# Patient Record
Sex: Male | Born: 1960 | Race: White | Hispanic: No | Marital: Married | Smoking: Former smoker
Health system: Southern US, Community
[De-identification: ages and names within clinical notes are randomized; demographics above are authoritative.]

## PROBLEM LIST (undated history)

## (undated) DIAGNOSIS — I2510 Atherosclerotic heart disease of native coronary artery without angina pectoris: Secondary | ICD-10-CM

## (undated) DIAGNOSIS — I7121 Aneurysm of the ascending aorta, without rupture: Secondary | ICD-10-CM

## (undated) DIAGNOSIS — Q231 Congenital insufficiency of aortic valve: Secondary | ICD-10-CM

## (undated) DIAGNOSIS — Q2381 Bicuspid aortic valve: Secondary | ICD-10-CM

## (undated) DIAGNOSIS — I712 Thoracic aortic aneurysm, without rupture: Secondary | ICD-10-CM

## (undated) DIAGNOSIS — E785 Hyperlipidemia, unspecified: Secondary | ICD-10-CM

## (undated) HISTORY — DX: Aneurysm of the ascending aorta, without rupture: I71.21

## (undated) HISTORY — DX: Congenital insufficiency of aortic valve: Q23.1

## (undated) HISTORY — DX: Thoracic aortic aneurysm, without rupture: I71.2

## (undated) HISTORY — DX: Atherosclerotic heart disease of native coronary artery without angina pectoris: I25.10

## (undated) HISTORY — DX: Hyperlipidemia, unspecified: E78.5

## (undated) HISTORY — DX: Bicuspid aortic valve: Q23.81

---

## 2006-10-15 ENCOUNTER — Ambulatory Visit: Payer: Self-pay | Admitting: Internal Medicine

## 2008-10-18 ENCOUNTER — Emergency Department: Payer: Self-pay | Admitting: Emergency Medicine

## 2010-06-19 ENCOUNTER — Emergency Department: Payer: Self-pay | Admitting: Emergency Medicine

## 2011-05-24 ENCOUNTER — Encounter: Payer: Self-pay | Admitting: Internal Medicine

## 2011-05-24 ENCOUNTER — Ambulatory Visit (INDEPENDENT_AMBULATORY_CARE_PROVIDER_SITE_OTHER): Payer: BC Managed Care – PPO | Admitting: Internal Medicine

## 2011-05-24 VITALS — BP 102/65 | HR 54 | Temp 98.5°F | Resp 16 | Ht 68.0 in | Wt 186.8 lb

## 2011-05-24 DIAGNOSIS — R011 Cardiac murmur, unspecified: Secondary | ICD-10-CM | POA: Insufficient documentation

## 2011-05-24 DIAGNOSIS — E785 Hyperlipidemia, unspecified: Secondary | ICD-10-CM | POA: Insufficient documentation

## 2011-05-24 LAB — COMPREHENSIVE METABOLIC PANEL
ALT: 20 U/L (ref 0–53)
AST: 24 U/L (ref 0–37)
Alkaline Phosphatase: 57 U/L (ref 39–117)
BUN: 7 mg/dL (ref 6–23)
Calcium: 9.5 mg/dL (ref 8.4–10.5)
Chloride: 105 mEq/L (ref 96–112)
Creatinine, Ser: 0.9 mg/dL (ref 0.4–1.5)
Total Bilirubin: 0.9 mg/dL (ref 0.3–1.2)

## 2011-05-24 LAB — LIPID PANEL
HDL: 44.1 mg/dL (ref >39.00)
Total CHOL/HDL Ratio: 3
Triglycerides: 86 mg/dL (ref 0.0–149.0)
VLDL: 17.2 mg/dL (ref 0.0–40.0)

## 2011-05-24 NOTE — Assessment & Plan Note (Signed)
Patient reports history of cardiac murmur, however none appreciated on exam today. Will get previous notes as to evaluation and management.

## 2011-05-24 NOTE — Assessment & Plan Note (Signed)
Will check lipids and LFTs with labs today. Follow up 6 months and prn. 

## 2011-05-24 NOTE — Progress Notes (Signed)
Subjective:    Patient ID: Nicholas Juarez, male    DOB: 11/07/1960, 51 y.o.   MRN: [id number]  HPI 51 year old male with history of hyperlipidemia presents to establish care. He reports that he is generally feeling well. He notes that, in the past, he was told he has a cardiac murmur. He has had imaging in the past for this. He is unsure what kind murmur. He denies any palpitations, chest pain, shortness of breath, or other symptoms. He reports that he generally feels well. He is very active at his work. He notes some recent increased stress with his wife's diagnosis of possible seizure disorder, and his daughters recent pregnancy.  Outpatient Encounter Prescriptions as of 05/24/2011  Medication Sig Dispense Refill  . aspirin 81 MG tablet Take 81 mg by mouth daily.      . cholecalciferol (VITAMIN D) 400 UNITS TABS Take by mouth daily.      . Coenzyme Q10-Levocarnitine 100-20 MG CAPS Take by mouth daily.      . Multiple Vitamin (MULTIVITAMIN) tablet Take 1 tablet by mouth daily.      . simvastatin (ZOCOR) 10 MG tablet Take 10 mg by mouth at bedtime.        Review of Systems  Constitutional: Negative for fever, chills, activity change, appetite change, fatigue and unexpected weight change.  Eyes: Negative for visual disturbance.  Respiratory: Negative for cough and shortness of breath.   Cardiovascular: Negative for chest pain, palpitations and leg swelling.  Gastrointestinal: Negative for abdominal pain and abdominal distention.  Genitourinary: Negative for dysuria, urgency and difficulty urinating.  Musculoskeletal: Negative for arthralgias and gait problem.  Skin: Negative for color change and rash.  Hematological: Negative for adenopathy.  Psychiatric/Behavioral: Negative for sleep disturbance and dysphoric mood. The patient is not nervous/anxious.    BP 102/65  Pulse 54  Temp(Src) 98.5 F (36.9 C) (Oral)  Resp 16  Ht 5\' 8"  (1.727 m)  Wt 186 lb 12 oz (84.709 kg)  BMI 28.40 kg/m2   SpO2 97%     Objective:   Physical Exam  Constitutional: He is oriented to person, place, and time. He appears well-developed and well-nourished. No distress.  HENT:  Head: Normocephalic and atraumatic.  Right Ear: External ear normal.  Left Ear: External ear normal.  Nose: Nose normal.  Mouth/Throat: Oropharynx is clear and moist. No oropharyngeal exudate.  Eyes: Conjunctivae and EOM are normal. Pupils are equal, round, and reactive to light. Right eye exhibits no discharge. Left eye exhibits no discharge. No scleral icterus.  Neck: Normal range of motion. Neck supple. No tracheal deviation present. No thyromegaly present.  Cardiovascular: Normal rate, regular rhythm and normal heart sounds.  Exam reveals no gallop and no friction rub.   No murmur heard. Pulmonary/Chest: Effort normal and breath sounds normal. No respiratory distress. He has no wheezes. He has no rales. He exhibits no tenderness.  Abdominal: Soft. Bowel sounds are normal. He exhibits no distension and no mass. There is no tenderness. There is no rebound and no guarding.  Musculoskeletal: Normal range of motion. He exhibits no edema.  Lymphadenopathy:    He has no cervical adenopathy.  Neurological: He is alert and oriented to person, place, and time. No cranial nerve deficit. Coordination normal.  Skin: Skin is warm and dry. No rash noted. He is not diaphoretic. No erythema. No pallor.  Psychiatric: He has a normal mood and affect. His behavior is normal. Judgment and thought content normal.  Assessment & Plan:

## 2011-11-25 ENCOUNTER — Ambulatory Visit: Payer: BC Managed Care – PPO | Admitting: Internal Medicine

## 2012-02-26 ENCOUNTER — Encounter: Payer: Self-pay | Admitting: Internal Medicine

## 2012-02-26 ENCOUNTER — Ambulatory Visit (INDEPENDENT_AMBULATORY_CARE_PROVIDER_SITE_OTHER): Payer: BC Managed Care – PPO | Admitting: Internal Medicine

## 2012-02-26 VITALS — BP 114/70 | HR 72 | Temp 98.1°F | Wt 196.0 lb

## 2012-02-26 DIAGNOSIS — R5381 Other malaise: Secondary | ICD-10-CM

## 2012-02-26 DIAGNOSIS — R0609 Other forms of dyspnea: Secondary | ICD-10-CM

## 2012-02-26 DIAGNOSIS — R0683 Snoring: Secondary | ICD-10-CM

## 2012-02-26 NOTE — Assessment & Plan Note (Signed)
Symptoms of fatigue most likely related to sleep schedule and ongoing stressors at home. Encouraged him to take time for himself. Will check TSH, B12, CBC, CMP with labs. Given ongoing snoring, we'll also set up sleep study. Followup 4 weeks.

## 2012-02-26 NOTE — Progress Notes (Signed)
  Subjective:    Patient ID: Nicholas Juarez, male    DOB: [birth year], 52 y.o.   MRN: [id number]  HPI 52 year old male presents for followup. His primary concern today is several months of progressive fatigue. He notes that during the day, if he is at home, he falls asleep very easily after sitting in a chair for a few moments. He also notes snoring at night. This is been ongoing for years. He denies any change in appetite, change in bowel habits. He denies any chest pain or shortness of breath. He notes significant stressors at home and caring for his wife who has a seizure disorder and for his father-in-law. 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  Outpatient Encounter Prescriptions as of 02/26/2012  Medication Sig Dispense Refill  . Multiple Vitamin (MULTIVITAMIN) tablet Take 1 tablet by mouth daily.      Marland Kitchen aspirin 81 MG tablet Take 81 mg by mouth daily.       No facility-administered encounter medications on file as of 02/26/2012.   BP 114/70  Pulse 72  Temp(Src) 98.1 F (36.7 C) (Oral)  Wt 196 lb (88.905 kg)  BMI 29.81 kg/m2  SpO2 97%  Review of Systems  Constitutional: Positive for fatigue. Negative for fever, chills, activity change, appetite change and unexpected weight change.  Eyes: Negative for visual disturbance.  Respiratory: Negative for cough and shortness of breath.   Cardiovascular: Negative for chest pain, palpitations and leg swelling.  Gastrointestinal: Negative for abdominal pain and abdominal distention.  Genitourinary: Negative for dysuria, urgency and difficulty urinating.  Musculoskeletal: Negative for arthralgias and gait problem.  Skin: Negative for color change and rash.  Hematological: Negative for adenopathy.  Psychiatric/Behavioral: Negative for sleep disturbance and dysphoric mood. The patient is not nervous/anxious.        Objective:   Physical Exam  Constitutional: He is oriented to person, place, and time. He appears  well-developed and well-nourished. No distress.  HENT:  Head: Normocephalic and atraumatic.  Right Ear: External ear normal.  Left Ear: External ear normal.  Nose: Nose normal.  Mouth/Throat: Oropharynx is clear and moist. No oropharyngeal exudate.  Eyes: Conjunctivae and EOM are normal. Pupils are equal, round, and reactive to light. Right eye exhibits no discharge. Left eye exhibits no discharge. No scleral icterus.  Neck: Normal range of motion. Neck supple. No tracheal deviation present. No thyromegaly present.  Cardiovascular: Normal rate, regular rhythm and normal heart sounds.  Exam reveals no gallop and no friction rub.   No murmur heard. Pulmonary/Chest: Effort normal and breath sounds normal. No respiratory distress. He has no wheezes. He has no rales. He exhibits no tenderness.  Musculoskeletal: Normal range of motion. He exhibits no edema.  Lymphadenopathy:    He has no cervical adenopathy.  Neurological: He is alert and oriented to person, place, and time. No cranial nerve deficit. Coordination normal.  Skin: Skin is warm and dry. No rash noted. He is not diaphoretic. No erythema. No pallor.  Psychiatric: He has a normal mood and affect. His behavior is normal. Judgment and thought content normal.          Assessment & Plan:

## 2012-02-26 NOTE — Assessment & Plan Note (Signed)
Symptoms of snoring and daytime somnolence concerning for sleep apnea. Will set up sleep study.

## 2012-02-27 LAB — CBC WITH DIFFERENTIAL/PLATELET
Basophils Relative: 0.2 % (ref 0.0–3.0)
HCT: 44.4 % (ref 39.0–52.0)
Hemoglobin: 15.2 g/dL (ref 13.0–17.0)
Lymphocytes Relative: 21.1 % (ref 12.0–46.0)
Lymphs Abs: 1.4 10*3/uL (ref 0.7–4.0)
MCHC: 34.2 g/dL (ref 30.0–36.0)
Monocytes Relative: 10.1 % (ref 3.0–12.0)
Neutro Abs: 4.3 10*3/uL (ref 1.4–7.7)
RBC: 5.08 Mil/uL (ref 4.22–5.81)

## 2012-02-27 LAB — COMPREHENSIVE METABOLIC PANEL
ALT: 22 U/L (ref 0–53)
Alkaline Phosphatase: 70 U/L (ref 39–117)
CO2: 29 mEq/L (ref 19–32)
Creatinine, Ser: 0.9 mg/dL (ref 0.4–1.5)
GFR: 90.11 mL/min (ref >60.00)
Sodium: 138 mEq/L (ref 135–145)
Total Bilirubin: 0.8 mg/dL (ref 0.3–1.2)

## 2012-02-27 LAB — TSH: TSH: 1.06 u[IU]/mL (ref 0.35–5.50)

## 2012-02-27 LAB — VITAMIN B12: Vitamin B-12: 356 pg/mL (ref 211–911)

## 2012-03-20 ENCOUNTER — Ambulatory Visit: Payer: Self-pay | Admitting: Internal Medicine

## 2012-04-01 ENCOUNTER — Encounter: Payer: Self-pay | Admitting: Internal Medicine

## 2012-04-01 ENCOUNTER — Ambulatory Visit (INDEPENDENT_AMBULATORY_CARE_PROVIDER_SITE_OTHER): Payer: BC Managed Care – PPO | Admitting: Internal Medicine

## 2012-04-01 VITALS — BP 108/78 | HR 70 | Temp 98.3°F | Wt 192.0 lb

## 2012-04-01 DIAGNOSIS — R5381 Other malaise: Secondary | ICD-10-CM

## 2012-04-01 DIAGNOSIS — J320 Chronic maxillary sinusitis: Secondary | ICD-10-CM | POA: Insufficient documentation

## 2012-04-01 DIAGNOSIS — H6692 Otitis media, unspecified, left ear: Secondary | ICD-10-CM | POA: Insufficient documentation

## 2012-04-01 DIAGNOSIS — H6690 Otitis media, unspecified, unspecified ear: Secondary | ICD-10-CM

## 2012-04-01 DIAGNOSIS — G4733 Obstructive sleep apnea (adult) (pediatric): Secondary | ICD-10-CM | POA: Insufficient documentation

## 2012-04-01 MED ORDER — AMOXICILLIN-POT CLAVULANATE 875-125 MG PO TABS: 1.0000 | ORAL_TABLET | Freq: Two times a day (BID) | ORAL | Status: DC

## 2012-04-01 NOTE — Assessment & Plan Note (Signed)
Recent workup for fatigue including lab work was unremarkable. However, sleep study showed obstructive sleep apnea. This is likely cause of symptoms of daytime somnolence. Will follow through with the CPAP titration study.

## 2012-04-01 NOTE — Assessment & Plan Note (Signed)
Recent sleep study showed sleep apnea. Titration study is pending. Will follow.

## 2012-04-01 NOTE — Progress Notes (Signed)
Subjective:    Patient ID: Nicholas Juarez, male    DOB: 01-Apr-1960, 52 y.o.   MRN: [id number]  HPI 52 year old male presents for followup after recent visit for fatigue. Labs at that visit including blood counts, thyroid function, electrolytes were normal. However, sleep study performed showed moderate sleep apnea. CPAP titration is pending. Patient reports persistent ongoing daytime fatigue.  He is also concerned today about one week history of nasal congestion, left maxillary sinus pain and pressure. He denies any fever or chills. He reports purulent nasal drainage. He has been taking over-the-counter cough and cold preparations with no improvement. He denies chest pain or shortness of breath.  Outpatient Encounter Prescriptions as of 04/01/2012  Medication Sig Dispense Refill  . aspirin 81 MG tablet Take 81 mg by mouth daily.      . Multiple Vitamin (MULTIVITAMIN) tablet Take 1 tablet by mouth daily.      Marland Kitchen OVER THE COUNTER MEDICATION Omega Red      . amoxicillin-clavulanate (AUGMENTIN) 875-125 MG per tablet Take 1 tablet by mouth 2 (two) times daily.  20 tablet  0   No facility-administered encounter medications on file as of 04/01/2012.   BP 108/78  Pulse 70  Temp(Src) 98.3 F (36.8 C) (Oral)  Wt 192 lb (87.091 kg)  BMI 29.2 kg/m2  SpO2 97%  Review of Systems  Constitutional: Positive for fatigue. Negative for fever, chills, activity change, appetite change and unexpected weight change.  HENT: Positive for ear pain, congestion, postnasal drip and sinus pressure.   Eyes: Negative for visual disturbance.  Respiratory: Negative for cough and shortness of breath.   Cardiovascular: Negative for chest pain, palpitations and leg swelling.  Gastrointestinal: Negative for abdominal pain and abdominal distention.  Genitourinary: Negative for dysuria, urgency and difficulty urinating.  Musculoskeletal: Negative for arthralgias and gait problem.  Skin: Negative for color change and rash.   Hematological: Negative for adenopathy.  Psychiatric/Behavioral: Negative for sleep disturbance and dysphoric mood. The patient is not nervous/anxious.        Objective:   Physical Exam  Constitutional: He is oriented to person, place, and time. He appears well-developed and well-nourished. No distress.  HENT:  Head: Normocephalic and atraumatic.  Right Ear: External ear normal. Tympanic membrane is not erythematous and not bulging. A middle ear effusion is present.  Left Ear: External ear normal. Tympanic membrane is erythematous and bulging. A middle ear effusion is present.  Nose: Mucosal edema present. Left sinus exhibits maxillary sinus tenderness.  Mouth/Throat: Oropharynx is clear and moist. No oropharyngeal exudate.  Eyes: Conjunctivae and EOM are normal. Pupils are equal, round, and reactive to light. Right eye exhibits no discharge. Left eye exhibits no discharge. No scleral icterus.  Neck: Normal range of motion. Neck supple. No tracheal deviation present. No thyromegaly present.  Cardiovascular: Normal rate, regular rhythm and normal heart sounds.  Exam reveals no gallop and no friction rub.   No murmur heard. Pulmonary/Chest: Effort normal and breath sounds normal. No respiratory distress. He has no wheezes. He has no rales. He exhibits no tenderness.  Musculoskeletal: Normal range of motion. He exhibits no edema.  Lymphadenopathy:    He has no cervical adenopathy.  Neurological: He is alert and oriented to person, place, and time. No cranial nerve deficit. Coordination normal.  Skin: Skin is warm and dry. No rash noted. He is not diaphoretic. No erythema. No pallor.  Psychiatric: He has a normal mood and affect. His behavior is normal. Judgment and thought content normal.  Assessment & Plan:

## 2012-04-01 NOTE — Assessment & Plan Note (Signed)
Symptoms and exam consistent with left OM. Will treat with augmentin and prn ibuprofen. Pt will call if symptoms are not improving over next 24-48hr.

## 2012-04-01 NOTE — Patient Instructions (Signed)
Start antibiotics. Take ibuprofen 800mg  up to three times daily as needed for pain. Claritin as needed for congestion.  Otitis Media, Adult A middle ear infection is an infection in the space behind the eardrum. The medical name for this is "otitis media." It may happen after a common cold. It is caused by a germ that starts growing in that space. You may feel swollen glands in your neck on the side of the ear infection. HOME CARE INSTRUCTIONS   Take your medicine as directed until it is gone, even if you feel better after the first few days.  Only take over-the-counter or prescription medicines for pain, discomfort, or fever as directed by your caregiver.  Occasional use of a nasal decongestant a couple times per day may help with discomfort and help the eustachian tube to drain better. Follow up with your caregiver in 10 to 14 days or as directed, to be certain that the infection has cleared. Not keeping the appointment could result in a chronic or permanent injury, pain, hearing loss and disability. If there is any problem keeping the appointment, you must call back to this facility for assistance. SEEK IMMEDIATE MEDICAL CARE IF:   You are not getting better in 2 to 3 days.  You have pain that is not controlled with medication.  You feel worse instead of better.  You cannot use the medication as directed.  You develop swelling, redness or pain around the ear or stiffness in your neck. MAKE SURE YOU:   Understand these instructions.  Will watch your condition.  Will get help right away if you are not doing well or get worse. Document Released: 09/29/2003 Document Revised: 03/18/2011 Document Reviewed: 07/31/2007 [facility] Patient Information 2013 [place], [place].

## 2012-05-08 ENCOUNTER — Encounter: Payer: Self-pay | Admitting: Internal Medicine

## 2012-05-08 ENCOUNTER — Ambulatory Visit (INDEPENDENT_AMBULATORY_CARE_PROVIDER_SITE_OTHER): Payer: BC Managed Care – PPO | Admitting: Internal Medicine

## 2012-05-08 ENCOUNTER — Telehealth: Payer: Self-pay | Admitting: Internal Medicine

## 2012-05-08 VITALS — BP 108/80 | HR 78 | Temp 98.0°F | Wt 194.0 lb

## 2012-05-08 DIAGNOSIS — Z634 Disappearance and death of family member: Secondary | ICD-10-CM

## 2012-05-08 DIAGNOSIS — G4733 Obstructive sleep apnea (adult) (pediatric): Secondary | ICD-10-CM

## 2012-05-08 NOTE — Assessment & Plan Note (Signed)
Pt father-in-law recently passed away unexpectedly. Offered support today. Counseling in place for family. Follow up prn.

## 2012-05-08 NOTE — Telephone Encounter (Signed)
Spoke with [facility], they are faxing [place] the report so I can attach it to [facility]'s order sheet.

## 2012-05-08 NOTE — Progress Notes (Signed)
  Subjective:    Patient ID: Nicholas Juarez, male    DOB: 07-Jun-1960, 52 y.o.   MRN: [id number]  HPI 52YO male with h/o sleep apnea presents for follow up. Pt was scheduled to have CPAP titration but could not afford, and insurance would not cover. Continues to have some daytime fatigue. Wife reports snoring at night. Pt father-in-law recently passed away unexpectedly. He feels he is coping well. Provides support to his wife who is managing father's affairs.  Outpatient Encounter Prescriptions as of 05/08/2012  Medication Sig Dispense Refill  . aspirin 81 MG tablet Take 81 mg by mouth daily.      . Multiple Vitamin (MULTIVITAMIN) tablet Take 1 tablet by mouth daily.      Marland Kitchen OVER THE COUNTER MEDICATION Omega Red      . [DISCONTINUED] amoxicillin-clavulanate (AUGMENTIN) 875-125 MG per tablet Take 1 tablet by mouth 2 (two) times daily.  20 tablet  0   No facility-administered encounter medications on file as of 05/08/2012.   BP 108/80  Pulse 78  Temp(Src) 98 F (36.7 C) (Oral)  Wt 194 lb (87.998 kg)  BMI 29.5 kg/m2  SpO2 97%  Review of Systems  Constitutional: Negative for fever, chills, activity change, appetite change, fatigue and unexpected weight change.  Eyes: Negative for visual disturbance.  Respiratory: Negative for cough and shortness of breath.   Cardiovascular: Negative for chest pain, palpitations and leg swelling.  Gastrointestinal: Negative for abdominal pain and abdominal distention.  Genitourinary: Negative for dysuria, urgency and difficulty urinating.  Musculoskeletal: Negative for arthralgias and gait problem.  Skin: Negative for color change and rash.  Hematological: Negative for adenopathy.  Psychiatric/Behavioral: Negative for sleep disturbance and dysphoric mood. The patient is not nervous/anxious.        Objective:   Physical Exam  Constitutional: He is oriented to person, place, and time. He appears well-developed and well-nourished. No distress.  HENT:   Head: Normocephalic and atraumatic.  Right Ear: External ear normal.  Left Ear: External ear normal.  Nose: Nose normal.  Mouth/Throat: Oropharynx is clear and moist. No oropharyngeal exudate.  Eyes: Conjunctivae and EOM are normal. Pupils are equal, round, and reactive to light. Right eye exhibits no discharge. Left eye exhibits no discharge. No scleral icterus.  Neck: Normal range of motion. Neck supple. No tracheal deviation present. No thyromegaly present.  Cardiovascular: Normal rate, regular rhythm and normal heart sounds.  Exam reveals no gallop and no friction rub.   No murmur heard. Pulmonary/Chest: Effort normal and breath sounds normal. No respiratory distress. He has no wheezes. He has no rales. He exhibits no tenderness.  Musculoskeletal: Normal range of motion. He exhibits no edema.  Lymphadenopathy:    He has no cervical adenopathy.  Neurological: He is alert and oriented to person, place, and time. No cranial nerve deficit. Coordination normal.  Skin: Skin is warm and dry. No rash noted. He is not diaphoretic. No erythema. No pallor.  Psychiatric: He has a normal mood and affect. His behavior is normal. Judgment and thought content normal.          Assessment & Plan:

## 2012-05-08 NOTE — Assessment & Plan Note (Signed)
Pt insurance will not cover recommended CPAP titration. Will try to get records on original sleep study and estimate CPAP need, then order supplies. Follow up 3-6 months.

## 2012-10-23 ENCOUNTER — Other Ambulatory Visit: Payer: Self-pay | Admitting: Internal Medicine

## 2012-10-23 ENCOUNTER — Ambulatory Visit (INDEPENDENT_AMBULATORY_CARE_PROVIDER_SITE_OTHER): Payer: BC Managed Care – PPO | Admitting: Internal Medicine

## 2012-10-23 ENCOUNTER — Encounter: Payer: Self-pay | Admitting: Internal Medicine

## 2012-10-23 VITALS — BP 110/82 | HR 88 | Temp 98.3°F | Ht 68.0 in | Wt 190.0 lb

## 2012-10-23 DIAGNOSIS — H6690 Otitis media, unspecified, unspecified ear: Secondary | ICD-10-CM

## 2012-10-23 DIAGNOSIS — H6692 Otitis media, unspecified, left ear: Secondary | ICD-10-CM | POA: Insufficient documentation

## 2012-10-23 DIAGNOSIS — Z23 Encounter for immunization: Secondary | ICD-10-CM | POA: Insufficient documentation

## 2012-10-23 DIAGNOSIS — Z1211 Encounter for screening for malignant neoplasm of colon: Secondary | ICD-10-CM | POA: Insufficient documentation

## 2012-10-23 DIAGNOSIS — Z0000 Encounter for general adult medical examination without abnormal findings: Secondary | ICD-10-CM

## 2012-10-23 LAB — COMPREHENSIVE METABOLIC PANEL
ALT: 26 U/L (ref 0–53)
Albumin: 4 g/dL (ref 3.5–5.2)
Alkaline Phosphatase: 76 U/L (ref 39–117)
CO2: 28 mEq/L (ref 19–32)
Calcium: 9.6 mg/dL (ref 8.4–10.5)
Chloride: 103 mEq/L (ref 96–112)
Creatinine, Ser: 1.1 mg/dL (ref 0.4–1.5)
GFR: 74.97 mL/min (ref >60.00)
Potassium: 4.3 mEq/L (ref 3.5–5.1)
Total Protein: 7.7 g/dL (ref 6.0–8.3)

## 2012-10-23 LAB — LIPID PANEL
HDL: 37.7 mg/dL — ABNORMAL LOW (ref >39.00)
Triglycerides: 157 mg/dL — ABNORMAL HIGH (ref 0.0–149.0)

## 2012-10-23 LAB — CBC WITH DIFFERENTIAL/PLATELET
Basophils Relative: 0.6 % (ref 0.0–3.0)
Eosinophils Relative: 2.4 % (ref 0.0–5.0)
HCT: 43.5 % (ref 39.0–52.0)
Hemoglobin: 14.5 g/dL (ref 13.0–17.0)
Lymphocytes Relative: 21.2 % (ref 12.0–46.0)
Lymphs Abs: 1.8 10*3/uL (ref 0.7–4.0)
MCV: 88.1 fl (ref 78.0–100.0)
Monocytes Absolute: 0.9 10*3/uL (ref 0.1–1.0)
Monocytes Relative: 11 % (ref 3.0–12.0)
Neutro Abs: 5.5 10*3/uL (ref 1.4–7.7)
RBC: 4.94 Mil/uL (ref 4.22–5.81)
RDW: 13.9 % (ref 11.5–14.6)
WBC: 8.4 10*3/uL (ref 4.5–10.5)

## 2012-10-23 MED ORDER — AMOXICILLIN-POT CLAVULANATE 875-125 MG PO TABS: 1.0000 | ORAL_TABLET | Freq: Two times a day (BID) | ORAL | Status: DC

## 2012-10-23 NOTE — Assessment & Plan Note (Signed)
General medical exam normal today. Encouraged continued efforts at healthy diet and regular physical activity. Colonoscopy ordered. Will check labs today including CMP, lipid profile, PSA. Discussed the potential benefits and limitations of PSA testing. Influenza vaccine given today.

## 2012-10-23 NOTE — Assessment & Plan Note (Signed)
Exam is consistent with left otitis media. Will treat with Augmentin. Patient will use ibuprofen or Tylenol as needed for pain. Followup if symptoms are not improving.

## 2012-10-23 NOTE — Patient Instructions (Signed)
Please call or return to clinic if ear pain is persistent after starting antibiotics.

## 2012-10-23 NOTE — Progress Notes (Signed)
Stress Test requested.

## 2012-10-23 NOTE — Progress Notes (Signed)
Subjective:    Patient ID: Nicholas Juarez, male    DOB: [birth year], 52 y.o.   MRN: [id number]  HPI 52 year old male presents for annual exam. He reports that he is generally doing well. It has been a difficult time for his family as his wife had an attempted suicide and was hospitalized twice. He feels he is coping well. He continues to work full-time. He is physically active at work. He tries to follow a healthy diet. He is concerned about several week history of left ear pain. He denies any fever or chills. He denies nasal congestion, cough. He has not taken anything for this.  Outpatient Encounter Prescriptions as of 10/23/2012  Medication Sig Dispense Refill  . aspirin 81 MG tablet Take 81 mg by mouth daily.      . Multiple Vitamin (MULTIVITAMIN) tablet Take 1 tablet by mouth daily.      Marland Kitchen OVER THE COUNTER MEDICATION Omega Red       No facility-administered encounter medications on file as of 10/23/2012.   BP 110/82  Pulse 88  Temp(Src) 98.3 F (36.8 C) (Oral)  Ht 5\' 8"  (1.727 m)  Wt 190 lb (86.183 kg)  BMI 28.9 kg/m2  SpO2 96%  Review of Systems  Constitutional: Negative for fever, chills, activity change, appetite change, fatigue and unexpected weight change.  HENT: Positive for ear pain. Negative for ear discharge, postnasal drip, rhinorrhea, sinus pressure and sore throat.   Eyes: Negative for visual disturbance.  Respiratory: Negative for cough and shortness of breath.   Cardiovascular: Negative for chest pain, palpitations and leg swelling.  Gastrointestinal: Negative for abdominal pain and abdominal distention.  Genitourinary: Negative for dysuria, urgency and difficulty urinating.  Musculoskeletal: Negative for arthralgias and gait problem.  Skin: Negative for color change and rash.  Hematological: Negative for adenopathy.  Psychiatric/Behavioral: Negative for sleep disturbance and dysphoric mood. The patient is not nervous/anxious.        Objective:   Physical  Exam  Constitutional: He is oriented to person, place, and time. He appears well-developed and well-nourished. No distress.  HENT:  Head: Normocephalic and atraumatic.  Right Ear: Tympanic membrane, external ear and ear canal normal. No middle ear effusion.  Left Ear: External ear normal. Tympanic membrane is erythematous and bulging. A middle ear effusion is present.  Nose: Nose normal.  Mouth/Throat: Oropharynx is clear and moist. No oropharyngeal exudate.  Eyes: Conjunctivae and EOM are normal. Pupils are equal, round, and reactive to light. Right eye exhibits no discharge. Left eye exhibits no discharge. No scleral icterus.  Neck: Normal range of motion. Neck supple. No tracheal deviation present. No thyromegaly present.  Cardiovascular: Normal rate, regular rhythm and normal heart sounds.  Exam reveals no gallop and no friction rub.   No murmur heard. Pulmonary/Chest: Effort normal and breath sounds normal. No respiratory distress. He has no wheezes. He has no rales. He exhibits no tenderness.  Abdominal: Soft. Bowel sounds are normal. He exhibits no distension and no mass. There is no tenderness. There is no rebound and no guarding.  Musculoskeletal: Normal range of motion. He exhibits no edema.  Lymphadenopathy:    He has no cervical adenopathy.  Neurological: He is alert and oriented to person, place, and time. No cranial nerve deficit. Coordination normal.  Skin: Skin is warm and dry. No rash noted. He is not diaphoretic. No erythema. No pallor.  Psychiatric: He has a normal mood and affect. His behavior is normal. Judgment and thought content normal.  Assessment & Plan:

## 2012-10-29 ENCOUNTER — Telehealth: Payer: Self-pay | Admitting: *Deleted

## 2012-10-29 NOTE — Telephone Encounter (Signed)
Informed patient wife paperwork was faxed to his employer on Monday, originals mailed to home address on file.

## 2012-11-02 ENCOUNTER — Encounter: Payer: Self-pay | Admitting: *Deleted

## 2012-11-05 ENCOUNTER — Encounter: Payer: Self-pay | Admitting: Emergency Medicine

## 2012-11-09 ENCOUNTER — Ambulatory Visit: Payer: BC Managed Care – PPO | Admitting: Internal Medicine

## 2013-10-25 ENCOUNTER — Encounter: Payer: Self-pay | Admitting: Internal Medicine

## 2013-10-25 ENCOUNTER — Encounter: Payer: Self-pay | Admitting: *Deleted

## 2013-10-25 ENCOUNTER — Ambulatory Visit (INDEPENDENT_AMBULATORY_CARE_PROVIDER_SITE_OTHER): Payer: BC Managed Care – PPO | Admitting: Internal Medicine

## 2013-10-25 VITALS — BP 110/72 | HR 54 | Temp 98.0°F | Resp 14 | Ht 68.5 in | Wt 193.2 lb

## 2013-10-25 DIAGNOSIS — Z0000 Encounter for general adult medical examination without abnormal findings: Secondary | ICD-10-CM

## 2013-10-25 DIAGNOSIS — N529 Male erectile dysfunction, unspecified: Secondary | ICD-10-CM

## 2013-10-25 LAB — COMPREHENSIVE METABOLIC PANEL
ALT: 20 U/L (ref 0–53)
AST: 21 U/L (ref 0–37)
Albumin: 3.6 g/dL (ref 3.5–5.2)
Alkaline Phosphatase: 71 U/L (ref 39–117)
BILIRUBIN TOTAL: 0.7 mg/dL (ref 0.2–1.2)
BUN: 13 mg/dL (ref 6–23)
CALCIUM: 9.5 mg/dL (ref 8.4–10.5)
CO2: 28 mEq/L (ref 19–32)
CREATININE: 1 mg/dL (ref 0.4–1.5)
Chloride: 102 mEq/L (ref 96–112)
GFR: 88.44 mL/min (ref >60.00)
Glucose, Bld: 98 mg/dL (ref 70–99)
Potassium: 4.6 mEq/L (ref 3.5–5.1)
Sodium: 140 mEq/L (ref 135–145)
Total Protein: 7.6 g/dL (ref 6.0–8.3)

## 2013-10-25 LAB — CBC WITH DIFFERENTIAL/PLATELET
BASOS ABS: 0 10*3/uL (ref 0.0–0.1)
Basophils Relative: 0.4 % (ref 0.0–3.0)
EOS ABS: 0.6 10*3/uL (ref 0.0–0.7)
Eosinophils Relative: 6.7 % — ABNORMAL HIGH (ref 0.0–5.0)
HCT: 47.7 % (ref 39.0–52.0)
HEMOGLOBIN: 15.4 g/dL (ref 13.0–17.0)
LYMPHS PCT: 21.8 % (ref 12.0–46.0)
Lymphs Abs: 1.9 10*3/uL (ref 0.7–4.0)
MCHC: 32.4 g/dL (ref 30.0–36.0)
MCV: 90.1 fl (ref 78.0–100.0)
Monocytes Absolute: 0.8 10*3/uL (ref 0.1–1.0)
Monocytes Relative: 8.7 % (ref 3.0–12.0)
NEUTROS ABS: 5.4 10*3/uL (ref 1.4–7.7)
Neutrophils Relative %: 62.4 % (ref 43.0–77.0)
PLATELETS: 344 10*3/uL (ref 150.0–400.0)
RBC: 5.29 Mil/uL (ref 4.22–5.81)
RDW: 13.8 % (ref 11.5–15.5)
WBC: 8.7 10*3/uL (ref 4.0–10.5)

## 2013-10-25 LAB — LIPID PANEL
Cholesterol: 199 mg/dL (ref 0–200)
HDL: 39.6 mg/dL (ref >39.00)
LDL Cholesterol: 149 mg/dL — ABNORMAL HIGH (ref 0–99)
NONHDL: 159.4
Total CHOL/HDL Ratio: 5
Triglycerides: 54 mg/dL (ref 0.0–149.0)
VLDL: 10.8 mg/dL (ref 0.0–40.0)

## 2013-10-25 LAB — HM COLONOSCOPY

## 2013-10-25 MED ORDER — TADALAFIL 20 MG PO TABS: 10.0000 mg | ORAL_TABLET | ORAL | Status: DC | PRN

## 2013-10-25 NOTE — Assessment & Plan Note (Signed)
Occasional difficulty maintaining erection. Discussed options for treatment. Will start Cialis 10mg  prn. He will call if symptoms are not improved with this.

## 2013-10-25 NOTE — Progress Notes (Signed)
Pre visit review using our clinic review tool, if applicable. No additional management support is needed unless otherwise documented below in the visit note. 

## 2013-10-25 NOTE — Assessment & Plan Note (Signed)
General medical exam normal today. Discussed screening for prostate cancer and limitations of PSA testing. Will check PSA with labs today. Will also check CMP, lipids, CBC. Encouraged healthy diet and exercise. Encouraged him to take time for himself. He will call if anxiety/stress becoming a concern. Flu vaccine through his work. He declines colonoscopy.

## 2013-10-25 NOTE — Patient Instructions (Signed)

## 2013-10-25 NOTE — Progress Notes (Signed)
Subjective:    Patient ID: Nicholas JourneyJames W Kube Jr., male    DOB: [birth year], 53 y.o.   MRN: [id number]  HPI 53YO male presents for annual exam. Feeling well. Notes increased stress recently with son arrested for DWI. Continues to work 3rd shift, so notes some fatigue. Very physically active at work, walks all night. Trying to follow a healthy diet. Scheduled to get Flu shot at work. Does not want to have colonoscopy. Concerned about occasional erectile dysfunction. Unable to maintain erection at times. Question if any OTC meds might help.  Review of Systems  Constitutional: Negative for fever, chills, activity change, appetite change, fatigue and unexpected weight change.  Eyes: Negative for visual disturbance.  Respiratory: Negative for cough and shortness of breath.   Cardiovascular: Negative for chest pain, palpitations and leg swelling.  Gastrointestinal: Negative for nausea, vomiting, abdominal pain, diarrhea, constipation, blood in stool and abdominal distention.  Genitourinary: Negative for dysuria, urgency, discharge, difficulty urinating and penile pain.  Musculoskeletal: Negative for arthralgias and gait problem.  Skin: Negative for color change and rash.  Hematological: Negative for adenopathy.  Psychiatric/Behavioral: Positive for sleep disturbance. Negative for dysphoric mood. The patient is nervous/anxious.        Objective:    BP 110/72  Pulse 54  Temp(Src) 98 F (36.7 C) (Oral)  Resp 14  Ht 5' 8.5" (1.74 m)  Wt 193 lb 4 oz (87.658 kg)  BMI 28.95 kg/m2  SpO2 97% Physical Exam  Constitutional: He is oriented to person, place, and time. He appears well-developed and well-nourished. No distress.  HENT:  Head: Normocephalic and atraumatic.  Right Ear: External ear normal.  Left Ear: External ear normal.  Nose: Nose normal.  Mouth/Throat: Oropharynx is clear and moist. No oropharyngeal exudate.  Eyes: Conjunctivae and EOM are normal. Pupils are equal, round, and  reactive to light. Right eye exhibits no discharge. Left eye exhibits no discharge. No scleral icterus.  Neck: Normal range of motion. Neck supple. No tracheal deviation present. No thyromegaly present.  Cardiovascular: Normal rate, regular rhythm and normal heart sounds.  Exam reveals no gallop and no friction rub.   No murmur heard. Pulmonary/Chest: Effort normal and breath sounds normal. No respiratory distress. He has no wheezes. He has no rales. He exhibits no tenderness.  Abdominal: Soft. Bowel sounds are normal. He exhibits no distension and no mass. There is no tenderness. There is no rebound and no guarding.  Musculoskeletal: Normal range of motion. He exhibits no edema.  Lymphadenopathy:    He has no cervical adenopathy.  Neurological: He is alert and oriented to person, place, and time. No cranial nerve deficit. Coordination normal.  Skin: Skin is warm and dry. No rash noted. He is not diaphoretic. No erythema. No pallor.  Psychiatric: He has a normal mood and affect. His behavior is normal. Judgment and thought content normal.          Assessment & Plan:   Problem List Items Addressed This Visit     Unprioritized   Erectile dysfunction     Occasional difficulty maintaining erection. Discussed options for treatment. Will start Cialis 10mg  prn. He will call if symptoms are not improved with this.    Routine general medical examination at a health care facility - Primary     General medical exam normal today. Discussed screening for prostate cancer and limitations of PSA testing. Will check PSA with labs today. Will also check CMP, lipids, CBC. Encouraged healthy diet and exercise. Encouraged him  to take time for himself. He will call if anxiety/stress becoming a concern. Flu vaccine through his work. He declines colonoscopy.    Relevant Orders      PSA, total and free      CBC with Differential      Comprehensive metabolic panel      Lipid panel       Return in about 1  year (around 10/26/2014) for Physical.

## 2013-10-26 ENCOUNTER — Encounter: Payer: Self-pay | Admitting: *Deleted

## 2013-10-26 LAB — PSA, TOTAL AND FREE
PSA FREE: 0.21 ng/mL
PSA, Free Pct: 17 % — ABNORMAL LOW (ref >25)
PSA: 1.24 ng/mL (ref <=4.00)

## 2015-01-23 ENCOUNTER — Encounter: Payer: Self-pay | Admitting: Emergency Medicine

## 2015-01-23 ENCOUNTER — Observation Stay
Admit: 2015-01-23 | Discharge: 2015-01-23 | Disposition: A | Discharge status: Home / Self Care | Payer: BLUE CROSS/BLUE SHIELD | Attending: Internal Medicine | Admitting: Internal Medicine

## 2015-01-23 ENCOUNTER — Emergency Department: Payer: BLUE CROSS/BLUE SHIELD

## 2015-01-23 ENCOUNTER — Observation Stay
Admission: EM | Admit: 2015-01-23 | Discharge: 2015-01-24 | Disposition: A | Discharge status: Home / Self Care | Payer: BLUE CROSS/BLUE SHIELD | Attending: Internal Medicine | Admitting: Internal Medicine

## 2015-01-23 DIAGNOSIS — Z87891 Personal history of nicotine dependence: Secondary | ICD-10-CM | POA: Insufficient documentation

## 2015-01-23 DIAGNOSIS — Z79899 Other long term (current) drug therapy: Secondary | ICD-10-CM | POA: Insufficient documentation

## 2015-01-23 DIAGNOSIS — E785 Hyperlipidemia, unspecified: Secondary | ICD-10-CM | POA: Diagnosis not present

## 2015-01-23 DIAGNOSIS — Z886 Allergy status to analgesic agent status: Secondary | ICD-10-CM | POA: Insufficient documentation

## 2015-01-23 DIAGNOSIS — Z23 Encounter for immunization: Secondary | ICD-10-CM | POA: Insufficient documentation

## 2015-01-23 DIAGNOSIS — I712 Thoracic aortic aneurysm, without rupture: Secondary | ICD-10-CM | POA: Diagnosis not present

## 2015-01-23 DIAGNOSIS — R918 Other nonspecific abnormal finding of lung field: Secondary | ICD-10-CM | POA: Insufficient documentation

## 2015-01-23 DIAGNOSIS — J9811 Atelectasis: Secondary | ICD-10-CM | POA: Insufficient documentation

## 2015-01-23 DIAGNOSIS — D72829 Elevated white blood cell count, unspecified: Secondary | ICD-10-CM | POA: Diagnosis not present

## 2015-01-23 DIAGNOSIS — Z7982 Long term (current) use of aspirin: Secondary | ICD-10-CM | POA: Diagnosis not present

## 2015-01-23 DIAGNOSIS — N529 Male erectile dysfunction, unspecified: Secondary | ICD-10-CM | POA: Insufficient documentation

## 2015-01-23 DIAGNOSIS — R079 Chest pain, unspecified: Principal | ICD-10-CM | POA: Diagnosis present

## 2015-01-23 DIAGNOSIS — I2510 Atherosclerotic heart disease of native coronary artery without angina pectoris: Secondary | ICD-10-CM | POA: Diagnosis not present

## 2015-01-23 DIAGNOSIS — R0602 Shortness of breath: Secondary | ICD-10-CM | POA: Insufficient documentation

## 2015-01-23 DIAGNOSIS — Z91013 Allergy to seafood: Secondary | ICD-10-CM | POA: Diagnosis not present

## 2015-01-23 LAB — CBC
HEMATOCRIT: 45.7 % (ref 40.0–52.0)
HEMATOCRIT: 44.6 % (ref 40.0–52.0)
HEMOGLOBIN: 15.2 g/dL (ref 13.0–18.0)
HEMOGLOBIN: 14.7 g/dL (ref 13.0–18.0)
MCH: 28.9 pg (ref 26.0–34.0)
MCH: 28.8 pg (ref 26.0–34.0)
MCHC: 33.3 g/dL (ref 32.0–36.0)
MCHC: 33 g/dL (ref 32.0–36.0)
MCV: 86.9 fL (ref 80.0–100.0)
MCV: 87.1 fL (ref 80.0–100.0)
Platelets: 319 10*3/uL (ref 150–440)
Platelets: 305 10*3/uL (ref 150–440)
RBC: 5.27 MIL/uL (ref 4.40–5.90)
RBC: 5.12 MIL/uL (ref 4.40–5.90)
RDW: 13.8 % (ref 11.5–14.5)
RDW: 14 % (ref 11.5–14.5)
WBC: 18.4 10*3/uL — ABNORMAL HIGH (ref 3.8–10.6)
WBC: 14.4 10*3/uL — AB (ref 3.8–10.6)

## 2015-01-23 LAB — TROPONIN I
Troponin I: <0.03 ng/mL (ref <0.031)
Troponin I: <0.03 ng/mL (ref <0.031)

## 2015-01-23 LAB — BASIC METABOLIC PANEL
ANION GAP: 6 (ref 5–15)
BUN: 11 mg/dL (ref 6–20)
CHLORIDE: 104 mmol/L (ref 101–111)
CO2: 28 mmol/L (ref 22–32)
Calcium: 9.6 mg/dL (ref 8.9–10.3)
Creatinine, Ser: 0.9 mg/dL (ref 0.61–1.24)
GFR calc non Af Amer: >60 mL/min (ref >60)
Glucose, Bld: 128 mg/dL — ABNORMAL HIGH (ref 65–99)
Potassium: 3.8 mmol/L (ref 3.5–5.1)
Sodium: 138 mmol/L (ref 135–145)

## 2015-01-23 LAB — CREATININE, SERUM: CREATININE: 0.91 mg/dL (ref 0.61–1.24)

## 2015-01-23 MED ORDER — ASPIRIN EC 81 MG PO TBEC
81.0000 mg | DELAYED_RELEASE_TABLET | Freq: Every day | ORAL | Status: DC
  Administered 2015-01-24: 81 mg via ORAL
  Filled 2015-01-23: qty 1

## 2015-01-23 MED ORDER — ACETAMINOPHEN 325 MG PO TABS
650.0000 mg | ORAL_TABLET | Freq: Four times a day (QID) | ORAL | Status: DC | PRN
  Administered 2015-01-23 – 2015-01-24 (×2): 650 mg via ORAL

## 2015-01-23 MED ORDER — ALUM & MAG HYDROXIDE-SIMETH 200-200-20 MG/5ML PO SUSP
30.0000 mL | Freq: Four times a day (QID) | ORAL | Status: DC | PRN
Start: 2015-01-23 — End: 2015-01-24

## 2015-01-23 MED ORDER — INFLUENZA VAC SPLIT QUAD 0.5 ML IM SUSY
0.5000 mL | PREFILLED_SYRINGE | INTRAMUSCULAR | Status: AC
  Administered 2015-01-24: 0.5 mL via INTRAMUSCULAR
  Filled 2015-01-23: qty 0.5

## 2015-01-23 MED ORDER — ASPIRIN 81 MG PO CHEW
324.0000 mg | CHEWABLE_TABLET | Freq: Once | ORAL | Status: AC
  Administered 2015-01-23: 324 mg via ORAL
  Filled 2015-01-23: qty 4

## 2015-01-23 MED ORDER — METOPROLOL TARTRATE 25 MG PO TABS
25.0000 mg | ORAL_TABLET | Freq: Two times a day (BID) | ORAL | Status: DC
  Administered 2015-01-24: 25 mg via ORAL
  Filled 2015-01-23: qty 1

## 2015-01-23 MED ORDER — OMEGA-3-ACID ETHYL ESTERS 1 G PO CAPS
1.0000 g | ORAL_CAPSULE | Freq: Two times a day (BID) | ORAL | Status: DC
  Administered 2015-01-24: 1 g via ORAL
  Filled 2015-01-23: qty 1

## 2015-01-23 MED ORDER — NITROGLYCERIN 2 % TD OINT
1.0000 [in_us] | TOPICAL_OINTMENT | Freq: Once | TRANSDERMAL | Status: AC
  Administered 2015-01-23: 1 [in_us] via TOPICAL
  Filled 2015-01-23: qty 1

## 2015-01-23 MED ORDER — ACETAMINOPHEN 650 MG RE SUPP: 650.0000 mg | Freq: Four times a day (QID) | RECTAL | Status: DC | PRN

## 2015-01-23 MED ORDER — HEPARIN SODIUM (PORCINE) 5000 UNIT/ML IJ SOLN
5000.0000 [IU] | Freq: Three times a day (TID) | INTRAMUSCULAR | Status: DC
  Administered 2015-01-23 – 2015-01-24 (×4): 5000 [IU] via SUBCUTANEOUS
  Filled 2015-01-23 (×4): qty 1

## 2015-01-23 MED ORDER — NITROGLYCERIN 0.4 MG SL SUBL
0.4000 mg | SUBLINGUAL_TABLET | SUBLINGUAL | Status: DC | PRN
  Administered 2015-01-23: 0.4 mg via SUBLINGUAL
  Filled 2015-01-23: qty 1

## 2015-01-23 MED ORDER — NITROGLYCERIN 0.4 MG SL SUBL: 0.4000 mg | SUBLINGUAL_TABLET | SUBLINGUAL | Status: DC | PRN

## 2015-01-23 MED ORDER — IOHEXOL 350 MG/ML SOLN
75.0000 mL | Freq: Once | INTRAVENOUS | Status: AC | PRN
  Administered 2015-01-23: 75 mL via INTRAVENOUS

## 2015-01-23 MED ORDER — MORPHINE SULFATE (PF) 2 MG/ML IV SOLN
2.0000 mg | Freq: Four times a day (QID) | INTRAVENOUS | Status: DC | PRN
  Administered 2015-01-23: 2 mg via INTRAVENOUS
  Filled 2015-01-23: qty 1

## 2015-01-23 MED ORDER — PROMETHAZINE HCL 25 MG/ML IJ SOLN
12.5000 mg | Freq: Four times a day (QID) | INTRAMUSCULAR | Status: DC | PRN
  Administered 2015-01-23: 12.5 mg via INTRAVENOUS
  Filled 2015-01-23: qty 1

## 2015-01-23 MED ORDER — ADULT MULTIVITAMIN W/MINERALS CH
1.0000 | ORAL_TABLET | Freq: Every day | ORAL | Status: DC
  Administered 2015-01-23 – 2015-01-24 (×2): 1 via ORAL
  Filled 2015-01-23 (×2): qty 1

## 2015-01-23 MED ORDER — ONDANSETRON HCL 4 MG/2ML IJ SOLN
4.0000 mg | Freq: Four times a day (QID) | INTRAMUSCULAR | Status: DC | PRN
  Administered 2015-01-23: 4 mg via INTRAVENOUS
  Filled 2015-01-23: qty 2

## 2015-01-23 MED ORDER — ONDANSETRON HCL 4 MG PO TABS: 4.0000 mg | ORAL_TABLET | Freq: Four times a day (QID) | ORAL | Status: DC | PRN

## 2015-01-23 MED ORDER — ACETAMINOPHEN 325 MG PO TABS
650.0000 mg | ORAL_TABLET | Freq: Four times a day (QID) | ORAL | Status: DC | PRN
  Filled 2015-01-23 (×2): qty 2

## 2015-01-23 NOTE — Progress Notes (Signed)
*  PRELIMINARY RESULTS* Echocardiogram 2D Echocardiogram has been performed.  Nicholas Juarez 01/23/2015, 6:03 PM

## 2015-01-23 NOTE — Progress Notes (Signed)
Pt very nauseated, pt has been given zofran, since the zofran pt has thrown up twice, & is still feeling very nauseous. MD paged. Dr. Clint GuyHower to put in orders for nausea. Will continue to monitor. Shirley FriarAlexis Miller, RN

## 2015-01-23 NOTE — ED Provider Notes (Signed)
Time Seen: Approximately 1240  I have reviewed the triage notes  Chief Complaint: Chest Pain   History of Present Illness: Nicholas Juarez. is a 55 y.o. male who states he's had some intermittent chest discomfort that started mainly on the right side of his chest and then moved towards the midline. He states his discomfort started between 8:30 and 9:00 this morning. Patient states that time he's had no persistent nausea, vomiting, or focal weakness. Patient does describe some mild shortness of breath. He states the pain was in the right side of his neck. He denies any left-sided neck pain, jaw pain, left arm pain. Denies any abdominal pain back or flank discomfort.   Past Medical History  Diagnosis Date  . Hyperlipidemia     Patient Active Problem List   Diagnosis Date Noted  . Chest pain 01/23/2015  . Erectile dysfunction 10/25/2013  . Routine general medical examination at a health care facility 10/23/2012  . Need for prophylactic vaccination and inoculation against influenza 10/23/2012  . Obstructive sleep apnea 04/01/2012  . Other malaise and fatigue 02/26/2012  . Murmur, cardiac 05/24/2011    History reviewed. No pertinent past surgical history.  History reviewed. No pertinent past surgical history.  No current outpatient prescriptions on file.  Allergies:  Motrin and Shellfish allergy  Family History: Family History  Problem Relation Age of Onset  . Dementia Mother   . COPD Father   . Cancer Sister 60    breast    Social History: Social History  Substance Use Topics  . Smoking status: Former Games developer  . Smokeless tobacco: None  . Alcohol Use: Yes     Comment: seldom     Review of Systems:   10 point review of systems was performed and was otherwise negative:  Constitutional: No fever Eyes: No visual disturbances ENT: No sore throat, ear pain Cardiac: Chest pain is described as burning or aching Respiratory: No shortness of breath, wheezing, or  stridor Abdomen: No abdominal pain, no vomiting, No diarrhea Endocrine: No weight loss, No night sweats Extremities: No peripheral edema, cyanosis Skin: No rashes, easy bruising Neurologic: No focal weakness, trouble with speech or swollowing Urologic: No dysuria, Hematuria, or urinary frequency   Physical Exam:  ED Triage Vitals  Enc Vitals Group     BP 01/23/15 1127 127/75 mmHg     Pulse Rate 01/23/15 1127 112     Resp 01/23/15 1127 18     Temp 01/23/15 1127 98.4 F (36.9 C)     Temp Source 01/23/15 1127 Oral     SpO2 01/23/15 1127 94 %     Weight 01/23/15 1127 194 lb (87.998 kg)     Height 01/23/15 1127 5\' 10"  (1.778 m)     Head Cir --      Peak Flow --      Pain Score 01/23/15 1130 6     Pain Loc --      Pain Edu? --      Excl. in GC? --     General: Awake , Alert , and Oriented times 3; GCS 15 Head: Normal cephalic , atraumatic Eyes: Pupils equal , round, reactive to light Nose/Throat: No nasal drainage, patent upper airway without erythema or exudate.  Neck: Supple, Full range of motion, No anterior adenopathy or palpable thyroid masses Lungs: Clear to ascultation without wheezes , rhonchi, or rales Heart: Regular rate, regular rhythm without murmurs , gallops , or rubs Abdomen: Soft, non tender without  rebound, guarding , or rigidity; bowel sounds positive and symmetric in all 4 quadrants. No organomegaly .        Extremities: 2 plus symmetric pulses. No edema, clubbing or cyanosis Neurologic: normal ambulation, Motor symmetric without deficits, sensory intact Skin: warm, dry, no rashes   Labs:   All laboratory work was reviewed including any pertinent negatives or positives listed below:  Labs Reviewed  BASIC METABOLIC PANEL - Abnormal; Notable for the following:    Glucose, Bld 128 (*)    All other components within normal limits  CBC - Abnormal; Notable for the following:    WBC 18.4 (*)    All other components within normal limits  TROPONIN I  TROPONIN  I  TROPONIN I  CBC  CREATININE, SERUM  TROPONIN I  TROPONIN I  TROPONIN I   review of the laboratory work shows an isolated elevated white blood cell count. Initial troponin levels are negative  EKG:  ED ECG REPORT I, Jennye MoccasinBrian S Akua Blethen, the attending physician, personally viewed and interpreted this ECG.  Date: 01/23/2015 EKG Time: 1125 Rate: 113 Rhythm: normal sinus rhythm QRS Axis: Left axis deviation Intervals: Right bundle-branch block ST/T Wave abnormalities: Nonspecific ST-T wave changes Conduction Disutrbances: none Narrative Interpretation: The EKG is remarkably different from EKG performed on 10/23/12 at that time the patient had a normal sinus rhythm with normal axis and no right bundle-branch block pattern    Radiology:     EXAM: CT ANGIOGRAPHY CHEST WITH CONTRAST  TECHNIQUE: Multidetector CT imaging of the chest was performed using the standard protocol during bolus administration of intravenous contrast. Multiplanar CT image reconstructions and MIPs were obtained to evaluate the vascular anatomy.  CONTRAST: 75 mL OMNIPAQUE IOHEXOL 350 MG/ML SOLN  COMPARISON: None.  FINDINGS: No pulmonary embolus is identified. There is cardiomegaly. No pleural or pericardial effusion. The ascending aorta measures 4.1 cm in diameter. There is mild calcific coronary atherosclerosis. No axillary, hilar or mediastinal lymphadenopathy. The lungs demonstrate mild dependent atelectasis.  Imaged upper abdomen is unremarkable. No focal bony abnormality is seen.  Review of the MIP images confirms the above findings.  IMPRESSION: Negative for pulmonary embolus or acute disease.  Cardiomegaly.  The ascending thoracic aorta measures 4.1 cm in diameter consistent with ascending thoracic aortic aneurysm. Recommend semi-annual imaging followup by CTA or MRA and referral to cardiothoracic surgery if not already obtained. This recommendation follows  2010 ACCF/AHA/AATS/ACR/ASA/SCA/SCAI/SIR/STS/SVM Guidelines for the Diagnosis and Management of Patients With Thoracic Aortic Disease. Circulation. 2010; 121: [id number]  Calcific coronary artery disease.   Electronically Signed By: Drusilla Kannerhomas Dalessio M.D. On: 01/23/2015 13:13          DG Chest 2 View (Final result) Result time: 01/23/15 12:44:16   Final result by Rad Results In Interface (01/23/15 12:44:16)   Narrative:   CLINICAL DATA: Chest pain and shortness of breath for 4 hours.  EXAM: CHEST 2 VIEW  COMPARISON: 06/20/2010  FINDINGS: The heart is enlarged but stable. There is tortuosity and calcification of the thoracic aorta. Low lung volumes with vascular crowding atelectasis. Chronic bronchitic changes. No definite infiltrates, edema or effusions. The bony thorax is intact.  IMPRESSION: Low lung volumes with vascular crowding and atelectasis. No acute pulmonary findings.  Chronic bronchitic changes.  Stable cardiac enlargement.          I personally reviewed the radiologic studies   P  ED Course: * Patient's stay here was uneventful his differential included all life-threatening causes for chest pain Differential includes all  life-threatening causes for chest pain. This includes but is not exclusive to acute coronary syndrome, aortic dissection, pulmonary embolism, cardiac tamponade, community-acquired pneumonia, pericarditis, musculoskeletal chest wall pain, etc. The patient's initial enzymes are negative. He did have relief with nitroglycerin tablets and nitroglycerin ointment here in emergency department. He has a noticeably different EKG though there does not appear to be any signs of an ST wave elevation MI. Patient's never received any cardiac evaluation does have some risk factors and also have a risk factor is family for a thoracic dissection. His CT was negative for pulmonary embolism or dissection patient most likely requires a stress  echocardiogram.    Assessment:  Acute unspecified chest pain New-onset right bundle branch block   Final Clinical Impression:  Final diagnoses:  Chest pain syndrome     Plan:  Inpatient management            Jennye Moccasin, MD 01/23/15 [phone]

## 2015-01-23 NOTE — ED Notes (Signed)
Pt presents with shortness of breath and chest pain started today at 8am, pt was just sitting when the pain came on.

## 2015-01-23 NOTE — H&P (Signed)
[facility] [place] [facility]   PATIENT NAME: Nicholas Juarez    MR#:  [id number]  DATE OF BIRTH:  Mar 04, 1960  DATE OF ADMISSION:  01/23/2015  PRIMARY CARE PHYSICIAN: Wynona Dove, MD   REQUESTING/REFERRING PHYSICIAN: Dr.Quigley  CHIEF COMPLAINT:   Chief Complaint  Patient presents with  . Chest Pain    HISTORY OF PRESENT ILLNESS:  Nicholas Juarez  is a 55 y.o. male with an history comes in because of chest pain. Started on the right side of chest radiated to the middle of the chest. No nausea no vomiting, no shortness of breath. It did not radiate to the jaw or the left arm.  PAST MEDICAL HISTORY:   Past Medical History  Diagnosis Date  . Hyperlipidemia     PAST SURGICAL HISTOIRY:  History reviewed. No pertinent past surgical history.  SOCIAL HISTORY:   Social History  Substance Use Topics  . Smoking status: Former Games developer  . Smokeless tobacco: Not on file  . Alcohol Use: Yes     Comment: seldom    FAMILY HISTORY:   Family History  Problem Relation Age of Onset  . Dementia Mother   . COPD Father   . Cancer Sister 32    breast    DRUG ALLERGIES:   Allergies  Allergen Reactions  . Motrin [Ibuprofen] Other (See Comments)    Only high doses; skin tingling.  . Shellfish Allergy Other (See Comments)    Skin tingling.    REVIEW OF SYSTEMS:  CONSTITUTIONAL: No fever, fatigue or weakness.  EYES: No blurred or double vision.  EARS, NOSE, AND THROAT: No tinnitus or ear pain.  RESPIRATORY: No cough, shortness of breath, wheezing or hemoptysis.  CARDIOVASCULAR: No chest pain, orthopnea, edema.  GASTROINTESTINAL: No nausea, vomiting, diarrhea or abdominal pain.  GENITOURINARY: No dysuria, hematuria.  ENDOCRINE: No polyuria, nocturia,  HEMATOLOGY: No anemia, easy bruising or bleeding SKIN: No rash or lesion. MUSCULOSKELETAL: No joint pain or arthritis.   NEUROLOGIC: No tingling, numbness, weakness.  PSYCHIATRY: No  anxiety or depression.   MEDICATIONS AT HOME:   Prior to Admission medications   Medication Sig Start Date End Date Taking? Authorizing Provider  acetaminophen (TYLENOL) 325 MG tablet Take 650 mg by mouth every 6 (six) hours as needed for mild pain, moderate pain, fever or headache.   Yes Historical Provider, MD  aspirin 81 MG tablet Take 81 mg by mouth daily.   Yes Historical Provider, MD  Multiple Vitamin (MULTIVITAMIN) tablet Take 1 tablet by mouth daily.   Yes Historical Provider, MD  Omega-3 Fatty Acids (FISH OIL) 1200 MG CAPS Take 1 capsule by mouth daily.   Yes Historical Provider, MD  tadalafil (CIALIS) 20 MG tablet Take 0.5-1 tablets (10-20 mg total) by mouth every other day as needed for erectile dysfunction. 10/25/13  Yes Shelia Media, MD      VITAL SIGNS:  Blood pressure 104/73, pulse 92, temperature 98.4 F (36.9 C), temperature source Oral, resp. rate 12, height 5\' 10"  (1.778 m), weight 87.998 kg (194 lb), SpO2 95 %.  PHYSICAL EXAMINATION:  GENERAL:  55 y.o.-year-old patient lying in the bed with no acute distress.  EYES: Pupils equal, round, reactive to light and accommodation. No scleral icterus. Extraocular muscles intact.  HEENT: Head atraumatic, normocephalic. Oropharynx and nasopharynx clear.  NECK:  Supple, no jugular venous distention. No thyroid enlargement, no tenderness.  LUNGS: Normal breath sounds bilaterally, no wheezing, rales,rhonchi or crepitation. No use of accessory muscles of  respiration.  CARDIOVASCULAR: S1, S2 normal. No murmurs, rubs, or gallops.  ABDOMEN: Soft, nontender, nondistended. Bowel sounds present. No organomegaly or mass.  EXTREMITIES: No pedal edema, cyanosis, or clubbing.  NEUROLOGIC: Cranial nerves II through XII are intact. Muscle strength 5/5 in all extremities. Sensation intact. Gait not checked.  PSYCHIATRIC: The patient is alert and oriented x 3.  SKIN: No obvious rash, lesion, or ulcer.   LABORATORY PANEL:   CBC  Recent  Labs Lab 01/23/15 1146  WBC 18.4*  HGB 15.2  HCT 45.7  PLT 319   ------------------------------------------------------------------------------------------------------------------  Chemistries   Recent Labs Lab 01/23/15 1146  NA 138  K 3.8  CL 104  CO2 28  GLUCOSE 128*  BUN 11  CREATININE 0.90  CALCIUM 9.6   ------------------------------------------------------------------------------------------------------------------  Cardiac Enzymes  Recent Labs Lab 01/23/15 1435  TROPONINI <0.03   ------------------------------------------------------------------------------------------------------------------  RADIOLOGY:  Dg Chest 2 View  01/23/2015  CLINICAL DATA:  Chest pain and shortness of breath for 4 hours. EXAM: CHEST  2 VIEW COMPARISON:  06/20/2010 FINDINGS: The heart is enlarged but stable. There is tortuosity and calcification of the thoracic aorta. Low lung volumes with vascular crowding atelectasis. Chronic bronchitic changes. No definite infiltrates, edema or effusions. The bony thorax is intact. IMPRESSION: Low lung volumes with vascular crowding and atelectasis. No acute pulmonary findings. Chronic bronchitic changes. Stable cardiac enlargement. Electronically Signed   By: Rudie Meyer M.D.   On: 01/23/2015 12:44   Ct Angio Chest Pe W/cm &/or Wo Cm  01/23/2015  CLINICAL DATA:  Shortness of breath and chest pain beginning at 8 a.m. this morning. Initial encounter. EXAM: CT ANGIOGRAPHY CHEST WITH CONTRAST TECHNIQUE: Multidetector CT imaging of the chest was performed using the standard protocol during bolus administration of intravenous contrast. Multiplanar CT image reconstructions and MIPs were obtained to evaluate the vascular anatomy. CONTRAST:  75 mL OMNIPAQUE IOHEXOL 350 MG/ML SOLN COMPARISON:  None. FINDINGS: No pulmonary embolus is identified. There is cardiomegaly. No pleural or pericardial effusion. The ascending aorta measures 4.1 cm in diameter. There is mild  calcific coronary atherosclerosis. No axillary, hilar or mediastinal lymphadenopathy. The lungs demonstrate mild dependent atelectasis. Imaged upper abdomen is unremarkable. No focal bony abnormality is seen. Review of the MIP images confirms the above findings. IMPRESSION: Negative for pulmonary embolus or acute disease. Cardiomegaly. The ascending thoracic aorta measures 4.1 cm in diameter consistent with ascending thoracic aortic aneurysm. Recommend semi-annual imaging followup by CTA or MRA and referral to cardiothoracic surgery if not already obtained. This recommendation follows 2010 ACCF/AHA/AATS/ACR/ASA/SCA/SCAI/SIR/STS/SVM Guidelines for the Diagnosis and Management of Patients With Thoracic Aortic Disease. Circulation. 2010; 121: [id number] Calcific coronary artery disease. Electronically Signed   By: Drusilla Kanner M.D.   On: 01/23/2015 13:13    EKG:   Orders placed or performed during the hospital encounter of 01/23/15  . EKG 12-Lead  . EKG 12-Lead  . EKG 12-Lead  . EKG 12-Lead  . ED EKG within 10 minutes  . ED EKG within 10 minutes  . EKG 12-Lead  . EKG 12-Lead  . EKG 12-Lead  . EKG 12-Lead  Initial EKG shows 113 bpm;later EKG showed NSR 95 bpm.  IMPRESSION AND PLAN:   1. Chest pain; very atypical. But the ER doctor is concerned about initial EKG. Changes.pt did not have changes 2 yrs ago.Troponins are negative. Patient wants to stay overnight and get a stress test done tomorrow morning.  Aspirin, beta blockers, nitrates, cycle troponins. 2.leucocytosis;likley stress related;   All the  records are reviewed and case discussed with ED provider. Management plans discussed with the patient, family and they are in agreement.  CODE STATUS: full  TOTAL TIME TAKING CARE OF THIS PATIENT: 55minutes.    Katha HammingKONIDENA,Nicholas Juarez M.D on 01/23/2015 at 3:35 PM  Between 7am to 6pm - Pager - [phone]  After 6pm go to [email or web address] - password [email or web address]  [facility]   Office  [phone]  CC: Primary care physician; Wynona DoveWALKER,JENNIFER AZBELL, MD  Note: This dictation was prepared with Dragon dictation along with smaller phrase technology. Any transcriptional errors that result from this process are unintentional.

## 2015-01-23 NOTE — Progress Notes (Signed)
Pt complaining of chest pain radiating to his shoulders, pt only has tylenol for pain, tylenol has been given with no relief. MD paged. Dr. Elisabeth PigeonVachhani to order 2mg  morphine q6hr for pain. Will continue to monitor.

## 2015-01-24 ENCOUNTER — Telehealth: Payer: Self-pay | Admitting: Internal Medicine

## 2015-01-24 ENCOUNTER — Observation Stay: Payer: BLUE CROSS/BLUE SHIELD

## 2015-01-24 LAB — BASIC METABOLIC PANEL
ANION GAP: 5 (ref 5–15)
BUN: 11 mg/dL (ref 6–20)
CO2: 30 mmol/L (ref 22–32)
Calcium: 9.3 mg/dL (ref 8.9–10.3)
Chloride: 104 mmol/L (ref 101–111)
Creatinine, Ser: 0.96 mg/dL (ref 0.61–1.24)
GFR calc Af Amer: >60 mL/min (ref >60)
GLUCOSE: 109 mg/dL — AB (ref 65–99)
POTASSIUM: 4.2 mmol/L (ref 3.5–5.1)
Sodium: 139 mmol/L (ref 135–145)

## 2015-01-24 LAB — TROPONIN I: Troponin I: <0.03 ng/mL (ref <0.031)

## 2015-01-24 LAB — CBC
HEMATOCRIT: 43.9 % (ref 40.0–52.0)
HEMOGLOBIN: 14.3 g/dL (ref 13.0–18.0)
MCH: 28.3 pg (ref 26.0–34.0)
MCHC: 32.7 g/dL (ref 32.0–36.0)
MCV: 86.6 fL (ref 80.0–100.0)
Platelets: 293 10*3/uL (ref 150–440)
RBC: 5.07 MIL/uL (ref 4.40–5.90)
RDW: 13.8 % (ref 11.5–14.5)
WBC: 10.7 10*3/uL — ABNORMAL HIGH (ref 3.8–10.6)

## 2015-01-24 LAB — NM MYOCAR MULTI W/SPECT W/WALL MOTION / EF
LV sys vol: 30 mL
LVDIAVOL: 91 mL
NUC STRESS TID: 0.9
SDS: 0
SRS: 0
SSS: 1

## 2015-01-24 LAB — GLUCOSE, CAPILLARY: GLUCOSE-CAPILLARY: 100 mg/dL — AB (ref 65–99)

## 2015-01-24 MED ORDER — HYDROCODONE-ACETAMINOPHEN 5-325 MG PO TABS
1.0000 | ORAL_TABLET | ORAL | Status: DC | PRN
  Administered 2015-01-24: 1 via ORAL
  Filled 2015-01-24: qty 1

## 2015-01-24 MED ORDER — TECHNETIUM TC 99M SESTAMIBI - CARDIOLITE
32.2500 | Freq: Once | INTRAVENOUS | Status: AC | PRN
  Administered 2015-01-24: 32.25 via INTRAVENOUS

## 2015-01-24 MED ORDER — TECHNETIUM TC 99M SESTAMIBI - CARDIOLITE
12.2800 | Freq: Once | INTRAVENOUS | Status: AC | PRN
  Administered 2015-01-24: 10:00:00 12.28 via INTRAVENOUS

## 2015-01-24 NOTE — Discharge Instructions (Signed)
F/u Dr Wyn Quaker vascular surgery

## 2015-01-24 NOTE — Progress Notes (Signed)
Patient still c/o CP 4/10. Verbal order from Dr. Allena Katz received for norco. Trudee Kuster

## 2015-01-24 NOTE — Progress Notes (Signed)
Patient d/c'd home. Education provided, no questions at this time. Patient to be picked up by wife. Telemetry removed. Nicholas Juarez  

## 2015-01-24 NOTE — Progress Notes (Signed)
[facility]    Nicholas Juarez was admitted to the Hospital on 01/23/2015 and Discharged  01/24/2015 and should be excused from work/school   for 3  days starting 01/23/2015 , may return to work/school without any restrictions.  Call Enedina Finner MD, [facility]  [phone] with questions.  Steel Kerney M.D on 01/24/2015,at 2:33 PM

## 2015-01-24 NOTE — Discharge Summary (Signed)
[facility] Physicians -  at [facility]   PATIENT NAME: Nicholas Juarez    MR#:  [id number]  DATE OF BIRTH:  [birth year]  DATE OF ADMISSION:  01/23/2015 ADMITTING PHYSICIAN: Katha Hamming, MD  DATE OF DISCHARGE: 01/24/15  PRIMARY CARE PHYSICIAN: Wynona Dove, MD    ADMISSION DIAGNOSIS:  Chest pain syndrome [R07.9] Chest pain [R07.9]  DISCHARGE DIAGNOSIS:  Chest pain-negative w/u for MI Incidental Ascending Aorta Aneurysm on CT chest  SECONDARY DIAGNOSIS:   Past Medical History  Diagnosis Date  . Hyperlipidemia     HOSPITAL COURSE:  Nicholas Juarez is a 55 y.o. male with an history comes in with chest pain.  It did not radiate to the jaw or the left arm.  1. Chest pain appears atypical.  CE x3 negative.  Myoview stress test negative for ischemia. Aspirin, nitrates prn  2.leucocytosis;likley stress related  3. Incidental finding of ascending aorta anuerysm of 4.1 cm on CT Chest Spoke with Dr dew. Recommends outpt f/u Notified pt and wfie of it.  D/c home  CONSULTS OBTAINED:     DRUG ALLERGIES:   Allergies  Allergen Reactions  . Motrin [Ibuprofen] Other (See Comments)    Only high doses; skin tingling.  . Shellfish Allergy Other (See Comments)    Skin tingling.    DISCHARGE MEDICATIONS:   Current Discharge Medication List    CONTINUE these medications which have NOT CHANGED   Details  acetaminophen (TYLENOL) 325 MG tablet Take 650 mg by mouth every 6 (six) hours as needed for mild pain, moderate pain, fever or headache.    aspirin 81 MG tablet Take 81 mg by mouth daily.    Multiple Vitamin (MULTIVITAMIN) tablet Take 1 tablet by mouth daily.    Omega-3 Fatty Acids (FISH OIL) 1200 MG CAPS Take 1 capsule by mouth daily.    tadalafil (CIALIS) 20 MG tablet Take 0.5-1 tablets (10-20 mg total) by mouth every other day as needed for erectile dysfunction. Qty: 5 tablet, Refills: 11        If you experience worsening  of your admission symptoms, develop shortness of breath, life threatening emergency, suicidal or homicidal thoughts you must seek medical attention immediately by calling 911 or calling your MD immediately  if symptoms less severe.  You Must read complete instructions/literature along with all the possible adverse reactions/side effects for all the Medicines you take and that have been prescribed to you. Take any new Medicines after you have completely understood and accept all the possible adverse reactions/side effects.   Please note  You were cared for by a hospitalist during your hospital stay. If you have any questions about your discharge medications or the care you received while you were in the hospital after you are discharged, you can call the unit and asked to speak with the hospitalist on call if the hospitalist that took care of you is not available. Once you are discharged, your primary care physician will handle any further medical issues. Please note that NO REFILLS for any discharge medications will be authorized once you are discharged, as it is imperative that you return to your primary care physician (or establish a relationship with a primary care physician if you do not have one) for your aftercare needs so that they can reassess your need for medications and monitor your lab values. Today   SUBJECTIVE   Mild left upper chest pain earlier  VITAL SIGNS:  Blood pressure 110/71, pulse 84, temperature 97.8 F (36.6 C),  temperature source Oral, resp. rate 18, height  (1.778 m), weight 85.73 kg (189 lb), SpO2 96 %.  I/O:   Intake/Output Summary (Last 24 hours) at 01/24/15 1404 Last data filed at 01/24/15 0615  Gross per 24 hour  Intake      0 ml  Output   1225 ml  Net  -1225 ml    PHYSICAL EXAMINATION:  GENERAL:  55 y.o.-year-old patient lying in the bed with no acute distress.  EYES: Pupils equal, round, reactive to light and accommodation. No scleral icterus.  Extraocular muscles intact.  HEENT: Head atraumatic, normocephalic. Oropharynx and nasopharynx clear.  NECK:  Supple, no jugular venous distention. No thyroid enlargement, no tenderness.  LUNGS: Normal breath sounds bilaterally, no wheezing, rales,rhonchi or crepitation. No use of accessory muscles of respiration.  CARDIOVASCULAR: S1, S2 normal. No murmurs, rubs, or gallops.  ABDOMEN: Soft, non-tender, non-distended. Bowel sounds present. No organomegaly or mass.  EXTREMITIES: No pedal edema, cyanosis, or clubbing.  NEUROLOGIC: Cranial nerves II through XII are intact. Muscle strength 5/5 in all extremities. Sensation intact. Gait not checked.  PSYCHIATRIC: The patient is alert and oriented x 3.  SKIN: No obvious rash, lesion, or ulcer.   DATA REVIEW:   CBC   Recent Labs Lab 01/24/15 0554  WBC 10.7*  HGB 14.3  HCT 43.9  PLT 293    Chemistries   Recent Labs Lab 01/24/15 0554  NA 139  K 4.2  CL 104  CO2 30  GLUCOSE 109*  BUN 11  CREATININE 0.96  CALCIUM 9.3    Microbiology Results   No results found for this or any previous visit (from the past 240 hour(s)).  RADIOLOGY:  Dg Chest 2 View  01/23/2015  CLINICAL DATA:  Chest pain and shortness of breath for 4 hours. EXAM: CHEST  2 VIEW COMPARISON:  06/20/2010 FINDINGS: The heart is enlarged but stable. There is tortuosity and calcification of the thoracic aorta. Low lung volumes with vascular crowding atelectasis. Chronic bronchitic changes. No definite infiltrates, edema or effusions. The bony thorax is intact. IMPRESSION: Low lung volumes with vascular crowding and atelectasis. No acute pulmonary findings. Chronic bronchitic changes. Stable cardiac enlargement. Electronically Signed   By: Rudie Meyer M.D.   On: 01/23/2015 12:44   Ct Angio Chest Pe W/cm &/or Wo Cm  01/23/2015  CLINICAL DATA:  Shortness of breath and chest pain beginning at 8 a.m. this morning. Initial encounter. EXAM: CT ANGIOGRAPHY CHEST WITH CONTRAST  TECHNIQUE: Multidetector CT imaging of the chest was performed using the standard protocol during bolus administration of intravenous contrast. Multiplanar CT image reconstructions and MIPs were obtained to evaluate the vascular anatomy. CONTRAST:  75 mL OMNIPAQUE IOHEXOL 350 MG/ML SOLN COMPARISON:  None. FINDINGS: No pulmonary embolus is identified. There is cardiomegaly. No pleural or pericardial effusion. The ascending aorta measures 4.1 cm in diameter. There is mild calcific coronary atherosclerosis. No axillary, hilar or mediastinal lymphadenopathy. The lungs demonstrate mild dependent atelectasis. Imaged upper abdomen is unremarkable. No focal bony abnormality is seen. Review of the MIP images confirms the above findings. IMPRESSION: Negative for pulmonary embolus or acute disease. Cardiomegaly. The ascending thoracic aorta measures 4.1 cm in diameter consistent with ascending thoracic aortic aneurysm. Recommend semi-annual imaging followup by CTA or MRA and referral to cardiothoracic surgery if not already obtained. This recommendation follows 2010 ACCF/AHA/AATS/ACR/ASA/SCA/SCAI/SIR/STS/SVM Guidelines for the Diagnosis and Management of Patients With Thoracic Aortic Disease. Circulation. 2010; 121: Z610-R604 Calcific coronary artery disease. Electronically Signed   By:  Drusilla Kanner M.D.   On: 01/23/2015 13:13   Nm Myocar Multi W/spect W/wall Motion / Ef  01/24/2015   There was no ST segment deviation noted during stress.  The study is normal.  This is a low risk study.  The left ventricular ejection fraction is normal (55-65%).      Management plans discussed with the patient, family and they are in agreement.  CODE STATUS:     Code Status Orders        Start     Ordered   01/23/15 1448  Full code   Continuous     01/23/15 1450    Code Status History    Date Active Date Inactive Code Status Order ID Comments User Context   This patient has a current code status but no historical  code status.      TOTAL TIME TAKING CARE OF THIS PATIENT: 40  minutes.    Nicholas Juarez M.D on 01/24/2015 at 2:04 PM  Between 7am to 6pm - Pager - [phone] After 6pm go to [email or web address] - password [email or web address] [facility]  [facility] [facility]  Office  [phone]  CC: Primary care physician; Wynona Dove, MD

## 2015-01-24 NOTE — Progress Notes (Signed)
Dr. Ladona Mow notified RN that stress test is negative, Dr. Allena Katz notified.  Trudee Kuster

## 2015-01-24 NOTE — Telephone Encounter (Signed)
Does this need to be TCM follow up?

## 2015-01-24 NOTE — Telephone Encounter (Signed)
Will follow.

## 2015-01-24 NOTE — Care Management Obs Status (Deleted)
MEDICARE OBSERVATION STATUS NOTIFICATION   Patient Details  Name: Nicholas Juarez. MRN: [id number] Date of Birth: Dec 18, 1960   Medicare Observation Status Notification Given:  Yes    Marily Memos, RN 01/24/2015, 9:19 AM

## 2015-01-24 NOTE — Telephone Encounter (Signed)
HFU, Pt is being discharged today from the hospital. Diagnosis is Chest pain. Pt is scheduled for 02/01/2015 . Thank You!

## 2015-01-25 NOTE — Progress Notes (Signed)
[facility]         [place], [place].   01/25/2015  Patient: Nicholas Juarez   Date of Birth:  Aug 05, 1960  Date of admission:  01/23/2015  Date of Discharge  01/25/2015    To Whom it May Concern:   Blima Dessert  may return to work on 1/18.  WORK-EMPLOYMENT:  Full Duty  If you have any questions or concerns, please don't hesitate to call.  Sincerely,   Elby Showers M.D Pager [phone] Office : [phone]   .

## 2015-01-26 ENCOUNTER — Telehealth: Payer: Self-pay

## 2015-01-26 NOTE — Telephone Encounter (Signed)
Transition Care Management Follow-up Telephone Call   Date discharged? 01/24/15   How have you been since you were released from the hospital? I'm scared, but ok.  No pain.  I am more active but moving slowly between activities.  Appetite is good.   Do you understand why you were in the hospital? Yes, chest pain.   Do you understand the discharge instructions? Yes, and I am taking it easy and doing everything I am supposed to do.   Where were you discharged to? Home   Items Reviewed:  Medications reviewed: Yes, taking aspirin daily and continuing scheduled medications without issues.  Allergies reviewed: Yes, no changes  Dietary changes reviewed: Yes, heart healthy diet, no problems  Referrals reviewed: Yes, and I am scheduled for follow up with Dr. Wyn Quaker (Vascular).     Functional Questionnaire:   Activities of Daily Living (ADLs):   He states they are independent in the following: Independent in all ADLs. States they require assistance with the following: Does not need assistance at this time.   Any transportation issues/concerns?: No   Any patient concerns? Yes, request to discuss with PCP other options for choice of medical doctors, other than currently selected for vascular follow up.   Confirmed importance and date/time of follow-up visits scheduled Yes, appointment scheduled 02/01/15.  Provider Appointment booked with Dr. Dan Humphreys (PCP).  Confirmed with patient if condition begins to worsen call PCP or go to the ER.  Patient was given the office number and encouraged to call back with question or concerns.  : Yes, patient verbalized understanding.

## 2015-02-01 ENCOUNTER — Ambulatory Visit (INDEPENDENT_AMBULATORY_CARE_PROVIDER_SITE_OTHER): Payer: BLUE CROSS/BLUE SHIELD | Admitting: Internal Medicine

## 2015-02-01 ENCOUNTER — Encounter: Payer: Self-pay | Admitting: Internal Medicine

## 2015-02-01 VITALS — BP 115/76 | HR 82 | Temp 98.3°F | Ht 70.0 in | Wt 200.4 lb

## 2015-02-01 DIAGNOSIS — I7121 Aneurysm of the ascending aorta, without rupture: Secondary | ICD-10-CM | POA: Insufficient documentation

## 2015-02-01 DIAGNOSIS — I712 Thoracic aortic aneurysm, without rupture: Secondary | ICD-10-CM | POA: Diagnosis not present

## 2015-02-01 DIAGNOSIS — R55 Syncope and collapse: Secondary | ICD-10-CM | POA: Diagnosis not present

## 2015-02-01 DIAGNOSIS — R079 Chest pain, unspecified: Secondary | ICD-10-CM

## 2015-02-01 NOTE — Patient Instructions (Signed)
We will set up evaluation with Dr. Kirke Corin in cardiology.  We will also set up carotid dopplers.

## 2015-02-01 NOTE — Assessment & Plan Note (Signed)
4.1cm ascending aortic aneurysm noted on CT chest. Will plan for follow up every 6 months by cardiology or vascular for imaging. BP is well controlled.

## 2015-02-01 NOTE — Assessment & Plan Note (Signed)
Recent episode of chest pain and syncope. Workup to date has been normal. Will also order carotid dopplers for evaluation.

## 2015-02-01 NOTE — Progress Notes (Signed)
Pre visit review using our clinic review tool, if applicable. No additional management support is needed unless otherwise documented below in the visit note. 

## 2015-02-01 NOTE — Assessment & Plan Note (Signed)
Recent admission for chest pain. ECHO, stress myoview, cardiac markers all normal. CT chest showed ascending aortic aneurysm. He continues to have chest pain. Exam normal today. Will set up evaluation with cardiology. Question if cardiac cath might be helpful. Also question if arrhythmia may have triggered symptoms and syncope. Follow up after cardiology evaluation.

## 2015-02-01 NOTE — Progress Notes (Signed)
Subjective:    Patient ID: Nicholas Juarez., male    DOB: [birth year], 55 y.o.   MRN: [id number]  HPI  55YO male presents for hospital follow up.  ADMISSION: 01/23/2015 DISCHARGE: 01/24/2015  DIAGNOSIS: Chest pain   Last week, developed sudden onset of pain in right shoulder. This then spread across chest. Felt short of breath and sweaty. Daughter noticed he appeared "grey." Wife and daughter immediately brought him to hospital. He passed out on way to hospital. He denies preceding symptoms to this. No preceding illnesses.  ECHO was normal. Myoview stress test was negative. Cardiac markers were negative. CT chest showed ascending aortic aneurysm of 4.1cm.   Since discharge, continues to have bilateral chest pain, aching in nature, sometimes made worse by exertion. Feels short of breath with exertion.  Occasionally feels that heart is racing. Went to work last weekend, felt tired, but tolerated okay.  Wt Readings from Last 3 Encounters:  02/01/15 200 lb 6 oz (90.89 kg)  01/24/15 189 lb (85.73 kg)  10/25/13 193 lb 4 oz (87.658 kg)   BP Readings from Last 3 Encounters:  02/01/15 115/76  01/24/15 110/71  10/25/13 110/72    Past Medical History  Diagnosis Date  . Hyperlipidemia    Family History  Problem Relation Age of Onset  . Dementia Mother   . COPD Father   . Cancer Sister 70    breast   No past surgical history on file. Social History   Social History  . Marital Status: Married    Spouse Name: N/A  . Number of Children: N/A  . Years of Education: N/A   Social History Main Topics  . Smoking status: Former Games developer  . Smokeless tobacco: None  . Alcohol Use: Yes     Comment: seldom  . Drug Use: None  . Sexual Activity: Not Asked   Other Topics Concern  . None   Social History Narrative   Lives with wife in [place].    Work - Forensic scientist   Diet - regular   Exercise - none     Review of Systems  Constitutional: Positive for fatigue. Negative for  fever, chills, activity change, appetite change and unexpected weight change.  Eyes: Negative for visual disturbance.  Respiratory: Positive for chest tightness and shortness of breath. Negative for cough.   Cardiovascular: Positive for chest pain. Negative for palpitations and leg swelling.  Gastrointestinal: Negative for nausea, vomiting, abdominal pain, diarrhea, constipation and abdominal distention.  Genitourinary: Negative for dysuria, urgency and difficulty urinating.  Musculoskeletal: Negative for arthralgias and gait problem.  Skin: Negative for color change and rash.  Neurological: Positive for syncope and light-headedness. Negative for seizures, speech difficulty, weakness and numbness.  Hematological: Negative for adenopathy.  Psychiatric/Behavioral: Negative for sleep disturbance and dysphoric mood. The patient is not nervous/anxious.        Objective:    BP 115/76 mmHg  Pulse 82  Temp(Src) 98.3 F (36.8 C) (Oral)  Ht  (1.778 m)  Wt 200 lb 6 oz (90.89 kg)  BMI 28.75 kg/m2  SpO2 96% Physical Exam  Constitutional: He is oriented to person, place, and time. He appears well-developed and well-nourished. No distress.  HENT:  Head: Normocephalic and atraumatic.  Right Ear: External ear normal.  Left Ear: External ear normal.  Nose: Nose normal.  Mouth/Throat: Oropharynx is clear and moist. No oropharyngeal exudate.  Eyes: Conjunctivae and EOM are normal. Pupils are equal, round, and reactive to light. Right eye  exhibits no discharge. Left eye exhibits no discharge. No scleral icterus.  Neck: Normal range of motion. Neck supple. Carotid bruit is not present. No tracheal deviation present. No thyromegaly present.  Cardiovascular: Normal rate, regular rhythm and normal heart sounds.  Exam reveals no gallop and no friction rub.   No murmur heard. Pulmonary/Chest: Effort normal and breath sounds normal. No accessory muscle usage. No tachypnea. No respiratory distress. He  has no decreased breath sounds. He has no wheezes. He has no rhonchi. He has no rales. He exhibits no tenderness.  Musculoskeletal: Normal range of motion. He exhibits no edema.  Lymphadenopathy:    He has no cervical adenopathy.  Neurological: He is alert and oriented to person, place, and time. No cranial nerve deficit. Coordination normal.  Skin: Skin is warm and dry. No rash noted. He is not diaphoretic. No erythema. No pallor.  Psychiatric: He has a normal mood and affect. His behavior is normal. Judgment and thought content normal.          Assessment & Plan:   Problem List Items Addressed This Visit      Unprioritized   Ascending aortic aneurysm (HCC)    4.1cm ascending aortic aneurysm noted on CT chest. Will plan for follow up every 6 months by cardiology or vascular for imaging. BP is well controlled.       Chest pain - Primary    Recent admission for chest pain. ECHO, stress myoview, cardiac markers all normal. CT chest showed ascending aortic aneurysm. He continues to have chest pain. Exam normal today. Will set up evaluation with cardiology. Question if cardiac cath might be helpful. Also question if arrhythmia may have triggered symptoms and syncope. Follow up after cardiology evaluation.      Relevant Orders   Ambulatory referral to Cardiology   Faintness    Recent episode of chest pain and syncope. Workup to date has been normal. Will also order carotid dopplers for evaluation.      Relevant Orders   US Carotid Duplex Bilateral       Return in about 4 weeks (around 03/01/2015) for Recheck.

## 2015-02-02 ENCOUNTER — Other Ambulatory Visit: Payer: Self-pay | Admitting: Internal Medicine

## 2015-02-03 ENCOUNTER — Other Ambulatory Visit: Payer: Self-pay | Admitting: Internal Medicine

## 2015-02-03 DIAGNOSIS — M542 Cervicalgia: Secondary | ICD-10-CM

## 2015-02-07 ENCOUNTER — Other Ambulatory Visit: Payer: Self-pay | Admitting: Internal Medicine

## 2015-02-07 DIAGNOSIS — R55 Syncope and collapse: Secondary | ICD-10-CM

## 2015-02-07 DIAGNOSIS — M542 Cervicalgia: Secondary | ICD-10-CM

## 2015-02-09 ENCOUNTER — Ambulatory Visit: Payer: BLUE CROSS/BLUE SHIELD

## 2015-02-09 DIAGNOSIS — M542 Cervicalgia: Secondary | ICD-10-CM

## 2015-02-09 DIAGNOSIS — R55 Syncope and collapse: Secondary | ICD-10-CM | POA: Diagnosis not present

## 2015-02-17 ENCOUNTER — Ambulatory Visit (INDEPENDENT_AMBULATORY_CARE_PROVIDER_SITE_OTHER): Payer: BLUE CROSS/BLUE SHIELD | Admitting: Cardiovascular Disease

## 2015-02-17 ENCOUNTER — Ambulatory Visit: Payer: BLUE CROSS/BLUE SHIELD | Admitting: Cardiovascular Disease

## 2015-02-17 ENCOUNTER — Encounter: Payer: Self-pay | Admitting: Cardiovascular Disease

## 2015-02-17 VITALS — BP 130/81 | HR 54 | Ht 70.0 in | Wt 197.5 lb

## 2015-02-17 DIAGNOSIS — R011 Cardiac murmur, unspecified: Secondary | ICD-10-CM

## 2015-02-17 DIAGNOSIS — I7121 Aneurysm of the ascending aorta, without rupture: Secondary | ICD-10-CM

## 2015-02-17 DIAGNOSIS — R55 Syncope and collapse: Secondary | ICD-10-CM | POA: Diagnosis not present

## 2015-02-17 DIAGNOSIS — I712 Thoracic aortic aneurysm, without rupture: Secondary | ICD-10-CM | POA: Diagnosis not present

## 2015-02-17 DIAGNOSIS — R079 Chest pain, unspecified: Secondary | ICD-10-CM

## 2015-02-17 NOTE — Assessment & Plan Note (Signed)
This overall was small in size. Agree with repeat imaging in 6 months and if stable this can likely be monitored on a yearly basis. He reports that his grandfather had an aneurysm but she does not know the details. If blood pressure is elevated, recommend blood pressure control with an ARB.

## 2015-02-17 NOTE — Progress Notes (Signed)
Primary care physician: Dr. Victorino Dike walker  HPI  This is a 55 year old man who was referred by Dr. Dan Humphreys for evaluation of chest pain. The patient has no previous cardiac history and overall has been relatively healthy. He has no diabetes, hypertension or hyperlipidemia. He is not a smoker and has no family history of coronary artery disease. Last month he was hospitalized at [facility] for chest pain. He woke up on January 16 not feeling well with generalized weakness. He vomited once he felt like he was having a viral illness. His daughter came home and he was told that he looked grayish. He started having some left-sided chest pain and was taken to the emergency room. He was told that he passed out briefly in route to the emergency room. He was noted to be tachycardic on presentation with a wide-complex rhythm and a heart rate of 113 bpm. He was noted to have leukocytosis with a white cell count of 18,000. He ruled out for myocardial infarction. He had an echocardiogram which showed normal LV systolic function and no significant wall motion abnormalities. He underwent a nuclear stress test which showed fixed inferior wall defect suggestive of diaphragm attenuation.   CTA of the chest showed no evidence of pulmonary embolism. There was a small ascending aortic aneurysm measuring 4.1 cm.  The patient improved quickly and was discharged. He resumed work which is physical. He continues to complain of intermittent left-sided chest discomfort described as aching sensation both at rest and with physical activities. He denies palpitations. The chest pain is described as tightness feeling. 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  Allergies  Allergen Reactions  . Motrin [Ibuprofen] Other (See Comments)    Only high doses; skin tingling.  . Shellfish Allergy Other (See Comments)    Skin tingling.     Current Outpatient Prescriptions on File Prior to Visit  Medication Sig Dispense Refill   . acetaminophen (TYLENOL) 325 MG tablet Take 650 mg by mouth every 6 (six) hours as needed for mild pain, moderate pain, fever or headache.    . Multiple Vitamin (MULTIVITAMIN) tablet Take 1 tablet by mouth daily.    . OMEGA-3 KRILL OIL PO Take by mouth.     No current facility-administered medications on file prior to visit.     Past Medical History  Diagnosis Date  . Hyperlipidemia      History reviewed. No pertinent past surgical history.   Family History  Problem Relation Age of Onset  . Dementia Mother   . COPD Father   . Cancer Sister 65    breast     Social History   Social History  . Marital Status: Married    Spouse Name: N/A  . Number of Children: N/A  . Years of Education: N/A   Occupational History  . Not on file.   Social History Main Topics  . Smoking status: Former Games developer  . Smokeless tobacco: Not on file  . Alcohol Use: No     Comment: seldom  . Drug Use: No  . Sexual Activity: Not on file   Other Topics Concern  . Not on file   Social History Narrative   Lives with wife in [place].    Work - Forensic scientist   Diet - regular   Exercise - none      ROS A 10 point review of system was performed. It is negative other than that mentioned in the history of present illness.  PHYSICAL EXAM   BP 130/81 mmHg  Pulse 54  Ht  (1.778 m)  Wt 197 lb 8 oz (89.585 kg)  BMI 28.34 kg/m2 Constitutional: He is oriented to person, place, and time. He appears well-developed and well-nourished. No distress.  HENT: No nasal discharge.  Head: Normocephalic and atraumatic.  Eyes: Pupils are equal and round.  No discharge. Neck: Normal range of motion. Neck supple. No JVD present. No thyromegaly present.  Cardiovascular: Normal rate, regular rhythm, normal heart sounds. Exam reveals no gallop and no friction rub. No murmur heard.  Pulmonary/Chest: Effort normal and breath sounds normal. No stridor. No respiratory distress. He has no wheezes. He has  no rales. He exhibits no tenderness.  Abdominal: Soft. Bowel sounds are normal. He exhibits no distension. There is no tenderness. There is no rebound and no guarding.  Musculoskeletal: Normal range of motion. He exhibits no edema and no tenderness.  Neurological: He is alert and oriented to person, place, and time. Coordination normal.  Skin: Skin is warm and dry. No rash noted. He is not diaphoretic. No erythema. No pallor.  Psychiatric: He has a normal mood and affect. His behavior is normal. Judgment and thought content normal.       EKG: sinus bradycardia with possible old inferior infarct. Heart rate is 54 bpm    ASSESSMENT AND PLAN

## 2015-02-17 NOTE — Assessment & Plan Note (Signed)
The patient continues to complain of sternal chest tightness both at rest and with physical activities. I reviewed his recent workup which included a nuclear stress test. There was a fixed inferior wall defect and I could not completely exclude the possibility of prior inferior infarct. Also his EKG is suggestive of inferior infarct. Thus, I think we need a definitive diagnosis with her he has coronary artery disease or not. I discussed with him different options including cardiac catheterization versus coronary CTA. I think he is a good candidate for coronary CTA given that his symptoms are still overall atypical. Thus, would proceed with this option.

## 2015-02-17 NOTE — Patient Instructions (Signed)
Medication Instructions:  Your physician recommends that you continue on your current medications as directed. Please refer to the Current Medication list given to you today.   Labwork: none  Testing/Procedures:  Non-Cardiac CT Angiography (CTA), is a special type of CT scan that uses a computer to produce multi-dimensional views of major blood vessels throughout the body. In CT angiography, a contrast material is injected through an IV to help visualize the blood vessels  Date: February 15th ARRIVAL TIME 1:45pm   [facility], 1st Floor, Radiology [address] [place] [phone]    Cardiac CT Angiogram A cardiac CT angiogram is a test to help your health care provider find out why you are having chest pains or other symptoms of heart disease. The test uses an advanced type of X-ray machine that scans your heart and the area around the heart and creates multiple pictures of it. Other names for the test are coronary CT angiography, coronary artery scanning, and CTA.  The test is painless and fairly quick. It is noninvasive. That means it does not involve any type of surgery or cuts (incisions). Instead, a fluid called contrast dye is injected into an IV tube in your arm. The contrast dye acts as a highlighter as it flows through the veins. With the CT scan, it lets your health care provider see:   If the coronary arteries in your heart are more narrow than they should be, or if they are blocked.  If there is fluid around the heart.  If the muscles and tissues of the heart look weak or show signs of disease.  If the lungs contain any blood clots. LET [facility] CARE PROVIDER KNOW ABOUT:  Any allergies you have.   All medicines you are taking, including vitamins, herbs, eye drops, creams, and over-the-counter medicines.  Previous problems you or members of your family have had with the use of anesthetics.  Any blood disorders you  have.  Previous surgeries you have had.  Medical conditions you have. RISKS AND COMPLICATIONS Generally, this is a safe procedure. However, as with any procedure, problems can occur. Possible problems include:   Allergic reaction to the contrast dye. This can range from mild to severe and may include:   Itching at the IV tube insertion site.   Redness at the IV tube insertion site.   Hives.   Nausea.   Difficulty breathing.   Kidney failure.   Problems from radiation exposure. This test involves the use of radiation. Radiation exposure can be dangerous to a pregnant patient and fetus. If you are pregnant, shields are used to protect your belly and pelvic area. More details are available from your health care provider. BEFORE THE PROCEDURE  The day before the test:    Stop drinking caffeinated beverages. These include energy drinks, tea, soda, coffee, and hot chocolate.  Stop taking medicines to treat erectile dysfunction. They can interfere with medicines you may be given during the procedure. Check with your health care provider if you should stop taking any other medicines. On the day of the test:   About 4 hours before the test, stop eating and drinking anything but water as advised by your health care provider.  Avoid wearing jewelry. You will have to undress from the waist up and wear a hospital gown. PROCEDURE  The hair on your chest may need to be shaved. This is done because small sticky patches called electrodes are put on your chest. These transmit  information that helps monitor your heart during the test.  You might be given heart medicine during the test. This is done to control your heart rate during the test so a good image is obtained.  An IV tube will be inserted in your arm.  You will be asked to lie on a table with your arms above your head.  The contrast dye will be injected into the IV tube. You might feel warm or you may get a metallic taste  in your mouth.  The table you are lying on will move into a large machine that will do the scanning.  You will be able to see, hear, and talk to the person running the machine while you are in it. Follow that person's directions. You may be asked to hold your breath for 2-3 seconds as pictures are taken.  The CT machine will move around you to take pictures. Do not move while it is scanning. This helps to get a good image of your heart.  When the best possible pictures have been taken, the machine will be turned off. The table will move out of the machine. The IV tube will then be removed. AFTER THE PROCEDURE  You will be allowed to get dressed and return to your normal activities.  Results will be interpreted by the health care provider and the results will be discussed with you.   This information is not intended to replace advice given to you by your health care provider. Make sure you discuss any questions you have with your health care provider.   Document Released: 12/07/2007 Document Revised: 01/14/2014 Document Reviewed: 09/09/2012 Elsevier Interactive Patient Education 2016 [employer].   Follow-Up: Your physician recommends that you schedule a follow-up appointment as needed.    Any Other Special Instructions Will Be Listed Below (If Applicable).     If you need a refill on your cardiac medications before your next appointment, please call your pharmacy.

## 2015-02-17 NOTE — Assessment & Plan Note (Signed)
I do suspect that his recent episode was likely  triggered by a viral illness causing leukocytosis and tachycardia. He has no symptoms suggestive of arrhythmia the present time.

## 2015-02-20 ENCOUNTER — Telehealth: Payer: Self-pay

## 2015-02-20 ENCOUNTER — Other Ambulatory Visit: Payer: Self-pay

## 2015-02-20 DIAGNOSIS — R011 Cardiac murmur, unspecified: Secondary | ICD-10-CM

## 2015-02-20 NOTE — Telephone Encounter (Signed)
S/w Elizebeth [facility] Scheduling, of need to change CT Chest to coronary CT. Time changed to 9am on Feb 15, arrival time 8:45am  S/w pt wife, Steward Drone, who is agreeable w/time change to 8:45am

## 2015-02-22 ENCOUNTER — Ambulatory Visit (HOSPITAL_COMMUNITY): Payer: BLUE CROSS/BLUE SHIELD

## 2015-02-22 ENCOUNTER — Encounter (HOSPITAL_COMMUNITY): Payer: Self-pay

## 2015-02-22 ENCOUNTER — Ambulatory Visit (HOSPITAL_COMMUNITY)
Admission: RE | Admit: 2015-02-22 | Discharge: 2015-02-22 | Disposition: A | Discharge status: Home / Self Care | Payer: BLUE CROSS/BLUE SHIELD | Source: Ambulatory Visit | Attending: Cardiovascular Disease | Admitting: Cardiovascular Disease

## 2015-02-22 DIAGNOSIS — Q231 Congenital insufficiency of aortic valve: Secondary | ICD-10-CM | POA: Diagnosis not present

## 2015-02-22 DIAGNOSIS — I712 Thoracic aortic aneurysm, without rupture: Secondary | ICD-10-CM | POA: Insufficient documentation

## 2015-02-22 DIAGNOSIS — R079 Chest pain, unspecified: Secondary | ICD-10-CM | POA: Diagnosis not present

## 2015-02-22 DIAGNOSIS — I2510 Atherosclerotic heart disease of native coronary artery without angina pectoris: Secondary | ICD-10-CM | POA: Insufficient documentation

## 2015-02-22 MED ORDER — NITROGLYCERIN 0.4 MG SL SUBL
SUBLINGUAL_TABLET | SUBLINGUAL | Status: AC
  Administered 2015-02-22: 0.8 mg via SUBLINGUAL
  Filled 2015-02-22: qty 2

## 2015-02-22 MED ORDER — NITROGLYCERIN 0.4 MG SL SUBL
0.8000 mg | SUBLINGUAL_TABLET | Freq: Once | SUBLINGUAL | Status: AC
  Administered 2015-02-22: 0.8 mg via SUBLINGUAL

## 2015-02-22 MED ORDER — IOHEXOL 350 MG/ML SOLN
80.0000 mL | Freq: Once | INTRAVENOUS | Status: AC | PRN
  Administered 2015-02-22: 80 mL via INTRAVENOUS

## 2015-02-27 ENCOUNTER — Other Ambulatory Visit: Payer: Self-pay

## 2015-02-27 DIAGNOSIS — I6529 Occlusion and stenosis of unspecified carotid artery: Secondary | ICD-10-CM

## 2015-03-01 ENCOUNTER — Other Ambulatory Visit (INDEPENDENT_AMBULATORY_CARE_PROVIDER_SITE_OTHER): Payer: BLUE CROSS/BLUE SHIELD | Admitting: *Deleted

## 2015-03-01 DIAGNOSIS — I6529 Occlusion and stenosis of unspecified carotid artery: Secondary | ICD-10-CM | POA: Diagnosis not present

## 2015-03-02 LAB — LIPID PANEL
CHOLESTEROL TOTAL: 170 mg/dL (ref 100–199)
Chol/HDL Ratio: 4 ratio units (ref 0.0–5.0)
HDL: 43 mg/dL (ref >39)
LDL Calculated: 114 mg/dL — ABNORMAL HIGH (ref 0–99)
Triglycerides: 66 mg/dL (ref 0–149)
VLDL Cholesterol Cal: 13 mg/dL (ref 5–40)

## 2015-03-02 LAB — HEPATIC FUNCTION PANEL
ALBUMIN: 4.4 g/dL (ref 3.5–5.5)
ALK PHOS: 86 IU/L (ref 39–117)
ALT: 16 IU/L (ref 0–44)
AST: 19 IU/L (ref 0–40)
BILIRUBIN TOTAL: 0.5 mg/dL (ref 0.0–1.2)
BILIRUBIN, DIRECT: 0.12 mg/dL (ref 0.00–0.40)
Total Protein: 6.8 g/dL (ref 6.0–8.5)

## 2015-03-06 ENCOUNTER — Encounter: Payer: Self-pay | Admitting: Cardiovascular Disease

## 2015-03-06 ENCOUNTER — Ambulatory Visit (INDEPENDENT_AMBULATORY_CARE_PROVIDER_SITE_OTHER): Payer: BLUE CROSS/BLUE SHIELD | Admitting: Cardiovascular Disease

## 2015-03-06 VITALS — BP 110/72 | HR 56 | Ht 70.0 in | Wt 196.5 lb

## 2015-03-06 DIAGNOSIS — E785 Hyperlipidemia, unspecified: Secondary | ICD-10-CM

## 2015-03-06 MED ORDER — ATORVASTATIN CALCIUM 40 MG PO TABS
40.0000 mg | ORAL_TABLET | Freq: Every day | ORAL | Status: DC
Start: 2015-03-06 — End: 2016-03-19

## 2015-03-06 NOTE — Patient Instructions (Signed)
Medication Instructions:  Your physician has recommended you make the following change in your medication: Start Atorvastatin 40 mg Once daily.   Labwork: Your physician recommends that you return for lab work in: Lipid and liver panel in 6 weeks. Nothing to eat or drink after midnight prior to coming in for this lab work.    Testing/Procedures: None at this time  Follow-Up: Your physician wants you to follow-up in: 6 months with Dr. Kirke Corin. You will receive a reminder letter in the mail two months in advance. If you don't receive a letter, please call our office to schedule the follow-up appointment.   Any Other Special Instructions Will Be Listed Below (If Applicable).     If you need a refill on your cardiac medications before your next appointment, please call your pharmacy.

## 2015-03-06 NOTE — Progress Notes (Signed)
Cardiology Office Note   Date:  03/06/2015   ID:  Nicholas Robson., DOB 01-Jul-1960, MRN [id number]  PCP:  Wynona Dove, MD  Cardiologist:   Lorine Bears, MD   Chief Complaint  Patient presents with  . other    Would like CT A & lab test results. Meds reviewed by the patient verbally.       History of Present Illness: Nicholas Creveling. is a 55 y.o. male who presents for a follow-up visit regarding recently diagnosed ascending aortic aneurysm, bicuspid aortic valve and moderate nonobstructive one-vessel coronary artery disease. He was hospitalized briefly in January for atypical chest pain in the setting of a viral illness. He had an echocardiogram during the hospitalization and there was no comment about bicuspid aortic valve. However, I reviewed it personally and the aortic valve did appear bicuspid but without significant regurgitation or stenosis. Due to patient's continued symptoms of atypical chest pain,I scheduled him for CTA coronary arteries which showed a calcium score of 24, bicuspid aortic valve, ascending aortic aneurysm measuring 46 x 43 mm and moderate proximal LAD disease estimated to be 50-60%. The patient's chest pain is almost completely resolved. He continues to work 12 hour shift. His job is very physical and requires lifting.    Past Medical History  Diagnosis Date  . Hyperlipidemia     History reviewed. No pertinent past surgical history.   Current Outpatient Prescriptions  Medication Sig Dispense Refill  . acetaminophen (TYLENOL) 325 MG tablet Take 650 mg by mouth every 6 (six) hours as needed for mild pain, moderate pain, fever or headache.    Marland Kitchen aspirin 325 MG tablet Take 325 mg by mouth daily.    . Multiple Vitamin (MULTIVITAMIN) tablet Take 1 tablet by mouth daily.    . OMEGA-3 KRILL OIL PO Take by mouth.    Marland Kitchen atorvastatin (LIPITOR) 40 MG tablet Take 1 tablet (40 mg total) by mouth daily. 30 tablet 11   No current  facility-administered medications for this visit.    Allergies:   Motrin and Shellfish allergy    Social History:  The patient  reports that he has quit smoking. He does not have any smokeless tobacco history on file. He reports that he does not drink alcohol or use illicit drugs.   Family History:  The patient's family history includes COPD in his father; Cancer (age of onset: 58) in his sister; Dementia in his mother.     PHYSICAL EXAM: VS:  BP 110/72 mmHg  Pulse 56  Ht  (1.778 m)  Wt 196 lb 8 oz (89.132 kg)  BMI 28.19 kg/m2  SpO2 99% , BMI Body mass index is 28.19 kg/(m^2). GEN: Well nourished, well developed, in no acute distress HEENT: normal Neck: no JVD, carotid bruits, or masses Cardiac: RRR; no murmurs, rubs, or gallops,no edema  Respiratory:  clear to auscultation bilaterally, normal work of breathing GI: soft, nontender, nondistended, + BS MS: no deformity or atrophy Skin: warm and dry, no rash Neuro:  Strength and sensation are intact Psych: euthymic mood, full affect   EKG:  EKG is not ordered today.    Recent Labs: 01/24/2015: BUN 11; Creatinine, Ser 0.96; Hemoglobin 14.3; Platelets 293; Potassium 4.2; Sodium 139 03/01/2015: ALT 16    Lipid Panel    Component Value Date/Time   CHOL 170 03/01/2015 0835   CHOL 199 10/25/2013 0853   TRIG 66 03/01/2015 0835   HDL 43 03/01/2015 0835   HDL  39.60 10/25/2013 0853   CHOLHDL 4.0 03/01/2015 0835   CHOLHDL 5 10/25/2013 0853   VLDL 10.8 10/25/2013 0853   LDLCALC 114* 03/01/2015 0835   LDLCALC 149* 10/25/2013 0853      Wt Readings from Last 3 Encounters:  03/06/15 196 lb 8 oz (89.132 kg)  02/17/15 197 lb 8 oz (89.585 kg)  02/01/15 200 lb 6 oz (90.89 kg)      Other studies Reviewed: Additional studies/ records that were reviewed today include: echocardiogram, CTA of the coronary arteries.    ASSESSMENT AND PLAN:  1.  Coronary artery disease involving native coronary arteries without angina:  recent CTA showed 50-60% proximal LAD disease for which I recommend aggressive medical therapy and lifestyle changes. He is to continue on daily aspirin. Continue treatment of risk factors.  2.  Bicuspid aortic valve: with no significant stenosis or regurgitation. I will plan on following this with a yearly echocardiogram.  3. Ascending aortic aneurysm in the setting of bicuspid aortic valve: given the presence of bicuspid aortic valve, the threshold for surgical repair is 50 mm. He will require surgery once the aneurysm reaches 50 mm or his aortic valve becomes symptomatic with regurgitation stenosis. Will plan on repeating his CT of the aorta in 6 months.  4. Hyperlipidemia: given the presence of coronary artery disease, I recommend a target LDL of less than 70. His recent LDL was 114. Thus, I added atorvastatin 40 mg once daily. Repeat labs in 6 weeks.     Disposition:   FU with me in 6 months  Signed, Lorine Bears, MD  03/06/2015 4:34 PM    [facility]

## 2015-03-09 ENCOUNTER — Encounter: Payer: Self-pay | Admitting: Internal Medicine

## 2015-03-09 ENCOUNTER — Ambulatory Visit (INDEPENDENT_AMBULATORY_CARE_PROVIDER_SITE_OTHER): Payer: BLUE CROSS/BLUE SHIELD | Admitting: Internal Medicine

## 2015-03-09 VITALS — BP 127/72 | HR 62 | Temp 98.5°F | Ht 70.0 in | Wt 201.5 lb

## 2015-03-09 DIAGNOSIS — Q231 Congenital insufficiency of aortic valve: Secondary | ICD-10-CM | POA: Diagnosis not present

## 2015-03-09 DIAGNOSIS — R932 Abnormal findings on diagnostic imaging of liver and biliary tract: Secondary | ICD-10-CM | POA: Diagnosis not present

## 2015-03-09 DIAGNOSIS — I7121 Aneurysm of the ascending aorta, without rupture: Secondary | ICD-10-CM

## 2015-03-09 DIAGNOSIS — I712 Thoracic aortic aneurysm, without rupture: Secondary | ICD-10-CM | POA: Diagnosis not present

## 2015-03-09 NOTE — Assessment & Plan Note (Signed)
Reviewed CT which showed 3mm lesion in the liver. Unclear etiology. Recommended MRI liver. Order placed.

## 2015-03-09 NOTE — Progress Notes (Signed)
Pre visit review using our clinic review tool, if applicable. No additional management support is needed unless otherwise documented below in the visit note. 

## 2015-03-09 NOTE — Assessment & Plan Note (Signed)
Reviewed notes from Dr. Kirke Corin. Follow up CT pending for 6 months.

## 2015-03-09 NOTE — Progress Notes (Signed)
Subjective:    Patient ID: [name], male    DOB: 08/24/1960, 55 y.o.   MRN: [id number]  HPI  55YO male presents for follow up.  Recently seen by cardiology. Found to have aortic aneurysm and bicuspid aortic valve. Plans to repeat CT in 6 months. Also has 50-60% LAD stenosis. Plan for medical management.  Continues to have left chest pain and left arm pain and numbness. Has not yet returned to work.   Wt Readings from Last 3 Encounters:  03/09/15 201 lb 8 oz (91.4 kg)  03/06/15 196 lb 8 oz (89.132 kg)  02/17/15 197 lb 8 oz (89.585 kg)   BP Readings from Last 3 Encounters:  03/09/15 127/72  03/06/15 110/72  02/22/15 110/72    Past Medical History  Diagnosis Date  . Hyperlipidemia    Family History  Problem Relation Age of Onset  . Dementia Mother   . COPD Father   . Cancer Sister 53    breast   No past surgical history on file. Social History   Social History  . Marital Status: Married    Spouse Name: N/A  . Number of Children: N/A  . Years of Education: N/A   Social History Main Topics  . Smoking status: Former Games developer  . Smokeless tobacco: None  . Alcohol Use: No     Comment: seldom  . Drug Use: No  . Sexual Activity: Not Asked   Other Topics Concern  . None   Social History Narrative   Lives with wife in [place].    Work - Forensic scientist   Diet - regular   Exercise - none     Review of Systems  Constitutional: Positive for fatigue. Negative for fever, chills, activity change, appetite change and unexpected weight change.  Eyes: Negative for visual disturbance.  Respiratory: Positive for chest tightness and wheezing. Negative for cough and shortness of breath.   Cardiovascular: Positive for chest pain. Negative for palpitations and leg swelling.  Gastrointestinal: Negative for abdominal pain and abdominal distention.  Genitourinary: Negative for dysuria, urgency and difficulty urinating.  Musculoskeletal: Negative for arthralgias and  gait problem.  Skin: Negative for color change and rash.  Hematological: Negative for adenopathy.  Psychiatric/Behavioral: Negative for sleep disturbance and dysphoric mood. The patient is not nervous/anxious.        Objective:    BP 127/72 mmHg  Pulse 62  Temp(Src) 98.5 F (36.9 C) (Oral)  Ht  (1.778 m)  Wt 201 lb 8 oz (91.4 kg)  BMI 28.91 kg/m2  SpO2 98% Physical Exam  Constitutional: He is oriented to person, place, and time. He appears well-developed and well-nourished. No distress.  HENT:  Head: Normocephalic and atraumatic.  Right Ear: External ear normal.  Left Ear: External ear normal.  Nose: Nose normal.  Mouth/Throat: Oropharynx is clear and moist. No oropharyngeal exudate.  Eyes: Conjunctivae and EOM are normal. Pupils are equal, round, and reactive to light. Right eye exhibits no discharge. Left eye exhibits no discharge. No scleral icterus.  Neck: Normal range of motion. Neck supple. No tracheal deviation present. No thyromegaly present.  Cardiovascular: Normal rate, regular rhythm and normal heart sounds.  Exam reveals no gallop and no friction rub.   No murmur heard. Pulmonary/Chest: Effort normal and breath sounds normal. No accessory muscle usage. No tachypnea. No respiratory distress. He has no decreased breath sounds. He has no wheezes. He has no rhonchi. He has no rales. He exhibits no tenderness.  Musculoskeletal: Normal range of motion. He exhibits no edema.  Lymphadenopathy:    He has no cervical adenopathy.  Neurological: He is alert and oriented to person, place, and time. No cranial nerve deficit. Coordination normal.  Skin: Skin is warm and dry. No rash noted. He is not diaphoretic. No erythema. No pallor.  Psychiatric: He has a normal mood and affect. His behavior is normal. Judgment and thought content normal.          Assessment & Plan:   Problem List Items Addressed This Visit      Unprioritized   Abnormal CT of liver - Primary     Reviewed CT which showed 3mm lesion in the liver. Unclear etiology. Recommended MRI liver. Order placed.      Relevant Orders   MR Liver W Contrast   Ascending aortic aneurysm [facility])    Reviewed notes from Dr. Kirke Corin. Follow up CT pending for 6 months.      Bicuspid aortic valve    Reviewed cardiology notes and recent CT. Follow up CT pending for 6 months.          Return in about 4 weeks (around 04/06/2015) for Recheck.  Ronna Polio, MD Internal Medicine [facility]

## 2015-03-09 NOTE — Patient Instructions (Signed)
We will set up MRI of the liver to evaluate the lesion seen on CT.  Follow up 4 weeks.

## 2015-03-09 NOTE — Assessment & Plan Note (Signed)
Reviewed cardiology notes and recent CT. Follow up CT pending for 6 months.

## 2015-03-10 ENCOUNTER — Encounter: Payer: Self-pay | Admitting: Internal Medicine

## 2015-03-10 ENCOUNTER — Ambulatory Visit: Payer: BLUE CROSS/BLUE SHIELD | Admitting: Cardiovascular Disease

## 2015-03-24 ENCOUNTER — Telehealth: Payer: Self-pay | Admitting: Cardiovascular Disease

## 2015-03-24 NOTE — Telephone Encounter (Signed)
Pt calling stating in the last visit he was told by Dr Kirke CorinArida that in the next 2 years he would most likely have open heart Surgery Pt is asking if we can move that up, he is having issues with his job Also states that the pains are getting more and more each day  Pt c/o of Chest Pain: STAT if CP now or developed within 24 hours  1. Are you having CP right now? no  2. Are you experiencing any other symptoms (ex. SOB, nausea, vomiting, sweating)?   3. How long have you been experiencing CP? Been a while he states  4. Is your CP continuous or coming and going?   5. Have you taken Nitroglycerin? No, was told to just take Asprin

## 2015-03-26 NOTE — Telephone Encounter (Signed)
Usually surgery is recommended once the aneurysm reaches 50 mm but if he is having chest pain and wants to discuss surgery then I recommend referral [facility] to discuss the surgery and its indications.

## 2015-03-27 ENCOUNTER — Other Ambulatory Visit: Payer: Self-pay

## 2015-03-27 DIAGNOSIS — I253 Aneurysm of heart: Secondary | ICD-10-CM

## 2015-03-28 ENCOUNTER — Telehealth: Payer: Self-pay | Admitting: Internal Medicine

## 2015-03-28 ENCOUNTER — Telehealth: Payer: Self-pay | Admitting: Cardiovascular Disease

## 2015-03-28 NOTE — Telephone Encounter (Signed)
S/w Inocencio HomesGayle who documented information and states someone from the office will call back w/appt

## 2015-03-28 NOTE — Telephone Encounter (Signed)
S/w pt to inform him of upcoming appt w/Dr. Tyrone SageGerhardt at CT surgery. Provided address, phone number and provider information to pt who verbalized understanding.

## 2015-03-28 NOTE — Telephone Encounter (Signed)
[facility] showed benign findings in the liver, including small cysts and a hemangioma. No further workup is needed.

## 2015-03-29 NOTE — Telephone Encounter (Signed)
Notified pt. 

## 2015-04-04 ENCOUNTER — Telehealth: Payer: Self-pay | Admitting: Cardiovascular Disease

## 2015-04-04 NOTE — Telephone Encounter (Signed)
Pt had 2/27 OV with Dr. Kirke CorinArida. Six week L/L profile ordered but appt was not made. S/w pt who is agreeable w/labs however, states he needs to check his work schedule before making appt as he works 12 hour shifts. States he will call back to schedule.

## 2015-04-06 ENCOUNTER — Encounter: Payer: Self-pay | Admitting: Cardiothoracic Surgery

## 2015-04-06 ENCOUNTER — Institutional Professional Consult (permissible substitution) (INDEPENDENT_AMBULATORY_CARE_PROVIDER_SITE_OTHER): Payer: BLUE CROSS/BLUE SHIELD | Admitting: Cardiothoracic Surgery

## 2015-04-06 VITALS — BP 127/83 | HR 60 | Resp 20 | Ht 70.0 in | Wt 195.0 lb

## 2015-04-06 DIAGNOSIS — I712 Thoracic aortic aneurysm, without rupture: Secondary | ICD-10-CM | POA: Diagnosis not present

## 2015-04-06 DIAGNOSIS — I7121 Aneurysm of the ascending aorta, without rupture: Secondary | ICD-10-CM

## 2015-04-06 NOTE — Patient Instructions (Signed)
It's best to avoid activities that cause grunting or straining (medically referred to as a "valsalva maneuver"). This happens when a person bears down against a closed throat to increase the strength of arm or abdominal muscles. There's often a tendency to do this when lifting heavy weights, doing sit-ups, push-ups or chin-ups, etc., but it may be harmful.    Thoracic Aortic Aneurysm An aneurysm is a bulge in an artery. It happens when the wall of the artery is weakened or damaged. If the aneurysm gets too big, it bursts (ruptures) and severe bleeding occurs. A thoracic aortic aneurysm is an aneurysm that occurs in the first part of the aorta, between the heart and the diaphragm. The aorta is the main artery and supplies blood from the heart to the rest of the body. A thoracic aortic aneurysm can enlarge and rupture or blood can flow between the layers of the wall of the aorta through a tear (aorticdissection). Both of these conditions can cause bleeding inside the body and can be life threatening unless diagnosed and treated promptly. CAUSES  The exact cause of a thoracic aortic aneurysm is often unknown. Some contributing factors are:   A hardening of the arteries caused by the buildup of fat and other substances in the lining of a blood vessel (arteriosclerosis).  Inflammation of the walls of an artery (arteritis).  Connective tissue diseases, such as Marfan syndrome.  Injury or trauma to the aorta.  An infection, such as syphilis or staphylococcus, in the wall of the aorta (infectious aortitis) caused by bacteria. RISK FACTORS  Risk factors that contribute to a thoracic aortic aneurysm may include:  Age older than 60 years.  High blood pressure (hypertension).  Male gender.  Ethnicity (white race).  Obesity.  Family history of aneurysm (first degree relatives only).  Tobacco use. PREVENTION  The following healthy lifestyle habits may help decrease your risk of a thoracic  aortic aneurysm:  Quitting smoking. Smoking can raise your blood pressure and cause arteriosclerosis.  Limiting or avoiding alcohol.  Keeping your blood pressure, blood sugar level, and cholesterol levels within normal limits.  Decreasing your salt intake. In some people, too much salt can raise blood pressure and increase your risk of abdominal aortic aneurysm.  Eating a diet low in saturated fats and cholesterol.  Increasing your fiber intake by including whole grains, vegetables, and fruits in your diet. Eating these foods may help lower blood pressure.  Maintaining a healthy weight.  Staying physically active and exercising regularly. SYMPTOMS  The symptoms of thoracic aortic aneurysm may vary depending on the size and rate of growth of the aneurysm. Most grow slowly and do not have any symptoms. When symptoms do occur, they may include:  Pain (chest, back, sides, or abdomen). The pain may vary in intensity. A sudden onset of severe pain may indicate that the aneurysm has ruptured.  Hoarseness.  Cough.  Shortness of breath.  Swallowing problems.  Nausea or vomiting or both. DIAGNOSIS  Since most unruptured thoracic aortic aneurysms have no symptoms, they are often discovered during diagnostic exams for other conditions. An aneurysm may be found during the following procedures:  Ultrasonography (a one-time screening for thoracic aortic aneurysm by ultrasonography is also recommended for all men aged 65-75 years who have ever smoked).  X-ray exams.  A CT scan.  An MRI.  Angiography or arteriography. TREATMENT  Treatment of a thoracic aortic aneurysm depends on the size of your aneurysm, your age, and risk factors for rupture.   Medicine to control blood pressure and pain may be used to manage aneurysms smaller than 2.3 in (6 cm). Regular monitoring for enlargement may be recommended by your health care provider if:  The aneurysm is 1.2-1.5 in (3-4 cm) in size (an annual  ultrasonography may be recommended).  The aneurysm is 1.5-1.8 in (4-4.5 cm) in size (an ultrasonography every 6 months may be recommended).  The aneurysm is larger than 1.8 in (4.5 cm) in size (your health care provider may ask that you be examined by a vascular surgeon). If your aneurysm is larger than 2.2 in (5.5 cm) or if it is enlarging quickly, surgical repair may be recommended.   This information is not intended to replace advice given to you by your health care provider. Make sure you discuss any questions you have with your health care provider.   Document Released: 12/24/2004 Document Revised: 10/14/2012 Document Reviewed: 07/06/2012 Elsevier Interactive Patient Education [employer]2016 [employer] Inc[employer]

## 2015-04-06 NOTE — Progress Notes (Signed)
[address]       [place] [address]             [phone]                    Len Blalock Siri Cole. [facility] Medical Record #[id number] Date of Birth: 08/04/1960  Referring: Iran Ouch, MD Primary Care: Wynona Dove, MD  Chief Complaint:    Chief Complaint  Patient presents with  . Thoracic Aortic Aneurysm    Surgical eval Cardiac/Coronary CT 02/22/15 and 01/23/15, ECHO 01/13/2015    History of Present Illness:    Nicholas Juarez. 55 y.o. male is seen in the office Referred by Dr. Kirke Corin  for dilated ascending aorta.  He was hospitalized briefly in January2017  for atypical chest pain in the setting of a viral illness. He had an echocardiogram during the hospitalization and there was no comment about bicuspid aortic valve. However,Dr Kirke Corin reviewed the  aortic valve did appear bicuspid but without significant regurgitation or stenosis. And ordered cta of the coronary arteries.  CTA coronary arteries which showed a calcium score of 24, bicuspid aortic valve, ascending aortic aneurysm measuring 46 x 43 mm and moderate proximal LAD disease estimated to be 50-60%   He has no diabetes, hypertension or hyperlipidemia. He is not a smoker and has no family history of coronary artery disease  The patient has no family history of aortic dissection, he has 4 sisters all over and alive, one sister had history of breast cancer at age 62. His father died in his late 29s of COPD related to smoking, mother died in her 65s 1 maternal grandfather died of a ruptured abdominal aortic aneurysm. 1 maternal aunt died of myocardial infarction in her 70s after two-week hospitalization.  Current Activity/ Functional Status:  Patient is independent with mobility/ambulation, transfers, ADL's, IADL's.   Zubrod Score: At the time of surgery this patient's most appropriate activity status/level should be described as:     0    Normal activity, no symptoms     1     Restricted in physical strenuous activity but ambulatory, able to do out light work     2    Ambulatory and capable of self care, unable to do work activities, up and about               >50 % of waking hours                                  3    Only limited self care, in bed greater than 50% of waking hours     4    Completely disabled, no self care, confined to bed or chair     5    Moribund   Past Medical History  Diagnosis Date  . Hyperlipidemia     No past surgical history on file.  Family History  Problem Relation Age of Onset  . Dementia Mother   . COPD Father   . Cancer Sister 19    breast    Social History   Social History  . Marital Status: Married    Spouse Name: N/A  . Number of Children: N/A  . Years of Education: N/A   Occupational History  . Not on file.   Social History Main Topics  . Smoking status: Former Games developer  .  Smokeless tobacco: Not on file  . Alcohol Use: No     Comment: seldom  . Drug Use: No  . Sexual Activity: Not on file   Other Topics Concern  . Not on file   Social History Narrative   [place]    Work - Forensic scientistpressman   Diet - regular   Exercise - none     History  Smoking status  . Former Smoker  Smokeless tobacco  . Not on file    History  Alcohol Use No    Comment: seldom     Allergies  Allergen Reactions  . Motrin [Ibuprofen] Other (See Comments)    Only high doses; skin tingling.  . Shellfish Allergy Other (See Comments)    Skin tingling.    Current Outpatient Prescriptions  Medication Sig Dispense Refill  . acetaminophen (TYLENOL) 325 MG tablet Take 650 mg by mouth every 6 (six) hours as needed for mild pain, moderate pain, fever or headache.    Marland Kitchen. aspirin 325 MG tablet Take 325 mg by mouth daily.    Marland Kitchen. atorvastatin (LIPITOR) 40 MG tablet Take 1 tablet (40 mg total) by mouth daily. 30 tablet 11  . Multiple Vitamin (MULTIVITAMIN) tablet Take 1 tablet by mouth daily.    . OMEGA-3  KRILL OIL PO Take by mouth.     No current facility-administered medications for this visit.      Review of Systems:     Cardiac Review of Systems: Y or N  Chest Pain [  y  ]  Resting SOB [ n  ] Exertional SOB  [n  ]  Orthopnea [ n ]   Pedal Edema [ n  ]    Palpitations [n  ] Syncope  [once Mitch.Landerjan  ]   Presyncope [  n ]  General Review of Systems: [Y] = yes [  ]=no Constitional: recent weight change [  ];  Wt loss over the last 3 months [   ] anorexia [  ]; fatigue [  ]; nausea [  ]; night sweats [  ]; fever [  ]; or chills [  ];          Dental: poor dentition[  ]; Last Dentist visit:   Eye : blurred vision [  ]; diplopia [   ]; vision changes [  ];  Amaurosis fugax[  ]; Resp: cough [  ];  wheezing[n  ];  hemoptysis[n  ]; shortness of breath[y  ]; paroxysmal nocturnal dyspnea[  ]; dyspnea on exertion[y  ]; or orthopnea[  ];  GI:  gallstones[  ], vomiting[  ];  dysphagia[  ]; melena[  ];  hematochezia [  ]; heartburn[  ];   Hx of  Colonoscopy[  ]; GU: kidney stones [  ]; hematuria[  ];   dysuria [  ];  nocturia[  ];  history of     obstruction [  ]; urinary frequency [  ]             Skin: rash, swelling[  ];, hair loss[  ];  peripheral edema[  ];  or itching[  ]; Musculosketetal: myalgias[  ];  joint swelling[  ];  joint erythema[  ];  joint pain[  ];  back pain[  ];  Heme/Lymph: bruising[  ];  bleeding[  ];  anemia[  ];  Neuro: TIA[  ];  headaches[  ];  stroke[  ];  vertigo[  ];  seizures[  ];  paresthesias[  ];  difficulty walking[  ];  Psych:depression[  ]; anxiety[  ];  Endocrine: diabetes[n  ];  thyroid dysfunction[ n ];  Immunizations: Flu up to date [  ]; Pneumococcal up to date [  ];  Other:  Physical Exam: BP 127/83 mmHg  Pulse 60  Resp 20  Ht 5\' 10"  (1.778 m)  Wt 195 lb (88.451 kg)  BMI 27.98 kg/m2  SpO2 98%  PHYSICAL EXAMINATION: General appearance: alert, cooperative, appears stated age and no distress Head: Normocephalic, without obvious abnormality,  atraumatic Neck: no adenopathy, no carotid bruit, no JVD, supple, symmetrical, trachea midline and thyroid not enlarged, symmetric, no tenderness/mass/nodules Lymph nodes: Cervical, supraclavicular, and axillary nodes normal. Resp: clear to auscultation bilaterally Back: symmetric, no curvature. ROM normal. No CVA tenderness. Cardio: regular rate and rhythm, S1, S2 normal, no murmur, click, rub or gallop GI: soft, non-tender; bowel sounds normal; no masses,  no organomegaly Extremities: extremities normal, atraumatic, no cyanosis or edema Neurologic: Grossly normal  Diagnostic Studies & Laboratory data:     Recent Radiology Findings:  ADDENDUM REPORT: 02/22/2015 20:11 CLINICAL DATA: 55 year old male with exertional chest pain. EXAM: Cardiac/Coronary CT TECHNIQUE: The patient was scanned on a Philips 256 scanner. FINDINGS: A 120 kV prospective scan was triggered in the descending thoracic aorta at 111 HU's. Axial non-contrast 3 mm slices were carried out through the heart. The data set was analyzed on a dedicated work station and scored using the Agatson method. Gantry rotation speed was 270 msecs and collimation was .9 mm. 5 mg of iv Metoprolol and 0.8 mg of sl NTG was given. The 3D data set was reconstructed in 5% intervals of the 67-82 % of the R-R cycle. Diastolic phases were analyzed on a dedicated work station using MPR, MIP and VRT modes. The patient received 80 cc of contrast. Aorta: There is aneurysmal dilatation of the ascending aorta measuring 46 x 43 mm in its widest diameter. No dissection. No calcifications. Aortic Valve: Aortic valve is bicuspid with undeveloped raphe between left and right coronary cusp. Aortic root measures 39 x 34 mm. Coronary Arteries: Both coronary arteries originate in the same cusp but at the normal position. Right dominance. RCA is a very large dominant vessel that gives rise to PDA and PLA. There is no plaque. Left main is a large  artery that gives rise to LAD and LCX arteries and has no plaque. LAD is a large vessel that has moderate mixed plaque in its proximal segment with associated stenosis 50-60%. Mid, distal LAD and diagonal branches have no significant plaque. LCX artery is a small non-dominant artery that gives rise to one obtuse marginal branch. There is no plaque. Other findings: Present PFO. IMPRESSION: 1. Coronary calcium score of 24. This was 41 percentile for age and sex matched control. 2. Bicuspid aortic valve. 3. Aneurysmal dilatation of the ascending aorta measuring 46 x 43 mm. 4. Right coronary origin. 5. Moderate CAD in the proximal LAD. An aggressive medical management is recommended. A follow up MRA/CTA is recommended for evaluation of aortic aneurysm in 6 months. Tobias Alexander Electronically Signed  By: Tobias Alexander  On: 02/22/2015 20:11     Study Result     EXAM: OVER-READ INTERPRETATION CT CHEST  The following report is an over-read performed by radiologist Dr. Jarrett Ables [facility] on 02/22/2015. This over-read does not include interpretation of cardiac or coronary anatomy or pathology. The coronary CTA interpretation by the cardiologist is attached.  COMPARISON: 01/23/2015  FINDINGS: Mediastinum/Nodes: Ascending aortic aneurysm up to 4.6 cm diameter, it image 29 series 203, similar to 01/23/2015. Trace amount of fluid in the superior pericardial recess.  Mild distal esophageal wall thickening. Small amount of calcium along the aortic valve leaflets.  Lungs/Pleura: Unremarkable  Upper abdomen: On image 345 series 501 there is a 8 mm in diameter arterial phase enhancing lesion posteriorly in the right hepatic lobe. Questionable 3 mm enhancing lesion in the lateral segment left hepatic lobe, image 386 series 501.  Musculoskeletal: Mild thoracic spondylosis.  IMPRESSION: 1. Small arterial phase enhancing lesion  posteriorly in segment 7 of the liver. There are a variety of differential diagnostic considerations include flash-filling hemangioma, focal nodular hyperplasia, hepatocellular carcinoma, are hypervascular metastatic lesion. Consider hepatic protocol MRI with and without contrast for further characterization. There is a second questionable focus of arterial phase hype enhancement in the lateral segment left hepatic lobe. 2. Ascending aortic aneurysm. Not changed from 01/23/2015. 3. Mild distal esophageal wall thickening. The most common cause would be esophagitis.  Electronically Signed: By: Gaylyn Rong M.D. On: 02/22/2015 09:56    I have independently reviewed the above radiology studies  and reviewed the findings with the patient.     Recent Lab Findings: Lab Results  Component Value Date   WBC 10.7* 01/24/2015   HGB 14.3 01/24/2015   HCT 43.9 01/24/2015   PLT 293 01/24/2015   GLUCOSE 109* 01/24/2015   CHOL 170 03/01/2015   TRIG 66 03/01/2015   HDL 43 03/01/2015   LDLCALC 114* 03/01/2015   ALT 16 03/01/2015   AST 19 03/01/2015   NA 139 01/24/2015   K 4.2 01/24/2015   CL 104 01/24/2015   CREATININE 0.96 01/24/2015   BUN 11 01/24/2015   CO2 30 01/24/2015   TSH 2.44 10/23/2012   Aortic Size Index=     4.5    /Body surface area is 2.09 meters squared. =2.15  < 2.75 cm/m2      4% risk per year 2.75 to 4.25          8% risk per year > 4.25 cm/m2    20% risk per year     Assessment / Plan:   Patient with episodic non-exercise-related chest pain associated with left arm numbness, evaluated by cardiac CT by cardiology with 50% LAD lesion Bicuspid aortic valve with mildly dilated ascending aorta less than 4.5 cm without family history of dissections or dilated aorta on plan to see back in 6 months with a CTA of the chest Primary care has ordered mri of liver as suggested by "over read of " cta heart I reviewed the diagnosis with the patient and would not  recommend any surgical treatment at this time  Good blood pressure control, possibly including a beta blocker is important  I discussed with the patient avoiding "competitive weightlifting"-   It's best to avoid activities that cause grunting or straining (medically referred to as a "valsalva maneuver"). This happens when a person bears down against a closed throat to increase the strength of arm or abdominal muscles. There's often a tendency to do this when lifting heavy weights, doing sit-ups, push-ups or chin-ups, etc., but it may be harmful.    According to the 2010 ACC/AHA guidelines, we recommend patients with thoracic aortic disease to maintain a LDL of less than 70 and a HDL of greater than 50. We recommend their blood pressure to remain less than 135/85. The patient was educated to take lifelong prophylactic antibiotics prior  to any elective invasive procedure.   The [employer] of Cardiology [facility]) and the [place] Heart Association [facility]) have issued a statement to clarify 2 previous guidelines from the [facility], AHA, and collaborating societies addressing the risk of aortic dissection in patients with bicuspid aortic valves (BAV) and severe aortic enlargement. The 2 guidelines differ with regard to the recommended threshold of aortic root or ascending aortic dilatation that would justify surgical intervention in patients with bicuspid aortic valves. This new statement of clarification uses the ACC/AHA revised structure for delineating the Class of Recommendation and Level of Evidence to provide recommendation that replace those contained in Section 9.2.2.1 of the thoracic aortic disease guidelines and Section 5.1.3 of the valvular heart disease guideline. New recommendations in intervention in patients with BAV and dilatation of the aortic root (sinuses) or ascending aorta include:  . Operative intervention to repair or replace the aortic root (sinuses) or replace the ascending aorta is  indicated in asymptomatic patients with BAV if the diameter of the aortic root or ascending aorta is 5.5 cm or greater. (Class of recommendation 1, Level of evidence B-NR).  Marland Kitchen Operative intervention to repair or replace the aortic root (sinuses) or replace the ascending aorta is reasonable in asymptomatic patients with BAV if the diameter of the aortic root or ascending aorta is 5.0 cm or greater and an additional risk factor for dissection is present or if the patient is at low surgical risk and the surgery is performed by an experienced aortic surgical team in a center with established expertise in these procedures. (Class of recommendation IIa; Level of Evidence B-NR).  . Replacement of the ascending aorta is reasonable in patients with BAV undergoing AVR because of severe aortic stenosis or aortic regurgitation when the diameter of the ascending aorta is greater than 4.5 cm (Class of recommendation IIa; Level of evidence C-EO).  Citation: Gae Bon, Kristen Loader, et al. Surgery for aortic dilatation in patients with bicuspid aortic valves. A statement of clarification from the [employer] of Cardiology/American Heart Association Task Force on Clinical Practice Guidelines. [Published online ahead of print December 10, 2013]. Circulation. doi: 10.1161/CIR.0000000000000331. I  spent 40 minutes counseling the patient face to face and 50% or more the  time was spent in counseling and coordination of care. The total time spent in the appointment was 60 minutes.  Delight Ovens MD      [address] [place] [place] [address] Office [phone]   Beeper [phone]  04/06/2015 11:37 AM

## 2015-04-14 ENCOUNTER — Ambulatory Visit: Payer: BLUE CROSS/BLUE SHIELD | Admitting: Internal Medicine

## 2015-04-17 ENCOUNTER — Ambulatory Visit (INDEPENDENT_AMBULATORY_CARE_PROVIDER_SITE_OTHER): Payer: BLUE CROSS/BLUE SHIELD | Admitting: Internal Medicine

## 2015-04-17 ENCOUNTER — Encounter: Payer: Self-pay | Admitting: Internal Medicine

## 2015-04-17 VITALS — BP 101/71 | HR 85 | Temp 98.5°F | Ht 70.0 in | Wt 199.1 lb

## 2015-04-17 DIAGNOSIS — R079 Chest pain, unspecified: Secondary | ICD-10-CM | POA: Diagnosis not present

## 2015-04-17 DIAGNOSIS — E785 Hyperlipidemia, unspecified: Secondary | ICD-10-CM

## 2015-04-17 DIAGNOSIS — Q231 Congenital insufficiency of aortic valve: Secondary | ICD-10-CM

## 2015-04-17 NOTE — Patient Instructions (Signed)
Labs today

## 2015-04-17 NOTE — Progress Notes (Signed)
Subjective:    Patient ID: Nicholas JourneyJames W Ledvina Jr., male    DOB: July 30, 1960, 55 y.o.   MRN: [id number]  HPI  55YO male presents for follow up.  Continues to have daily left sided chest pain. Left sided, radiates around chest wall. Worse with exertion. Resolves with rest. Not taking anything for this. [facility] Recommended follow up CTA in 6 months.   Wt Readings from Last 3 Encounters:  04/17/15 199 lb 2 oz (90.323 kg)  04/06/15 195 lb (88.451 kg)  03/09/15 201 lb 8 oz (91.4 kg)   BP Readings from Last 3 Encounters:  04/17/15 101/71  04/06/15 127/83  03/09/15 127/72    Past Medical History  Diagnosis Date  . Hyperlipidemia    Family History  Problem Relation Age of Onset  . Dementia Mother   . COPD Father   . Cancer Sister [id number]    breast   No past surgical history on file. Social History   Social History  . Marital Status: Married    Spouse Name: N/A  . Number of Children: N/A  . Years of Education: N/A   Social History Main Topics  . Smoking status: Former Games developermoker  . Smokeless tobacco: None  . Alcohol Use: No     Comment: seldom  . Drug Use: No  . Sexual Activity: Not Asked   Other Topics Concern  . None   Social History Narrative   [place] in [place].    Work - Forensic scientistpressman   Diet - regular   Exercise - none     Review of Systems  Constitutional: Negative for fever, chills, activity change, appetite change, fatigue and unexpected weight change.  Eyes: Negative for visual disturbance.  Respiratory: Negative for cough and shortness of breath.   Cardiovascular: Positive for chest pain. Negative for palpitations and leg swelling.  Gastrointestinal: Negative for nausea, abdominal pain, diarrhea, constipation and abdominal distention.  Genitourinary: Negative for dysuria, urgency and difficulty urinating.  Musculoskeletal: Negative for arthralgias and gait problem.  Skin: Negative for color change and rash.  Hematological:  Negative for adenopathy.  Psychiatric/Behavioral: Negative for sleep disturbance and dysphoric mood. The patient is not nervous/anxious.        Objective:    BP 101/71 mmHg  Pulse 85  Temp(Src) 98.5 F (36.9 C) (Oral)  Ht 5\' 10"  (1.778 m)  Wt 199 lb 2 oz (90.323 kg)  BMI 28.57 kg/m2  SpO2 96% Physical Exam  Constitutional: He is oriented to person, place, and time. He appears well-developed and well-nourished. No distress.  HENT:  Head: Normocephalic and atraumatic.  Right Ear: External ear normal.  Left Ear: External ear normal.  Nose: Nose normal.  Mouth/Throat: Oropharynx is clear and moist.  Eyes: Conjunctivae and EOM are normal. Pupils are equal, round, and reactive to light. Right eye exhibits no discharge. Left eye exhibits no discharge. No scleral icterus.  Neck: Normal range of motion. Neck supple. No tracheal deviation present. No thyromegaly present.  Cardiovascular: Normal rate, regular rhythm and normal heart sounds.  Exam reveals no gallop and no friction rub.   No murmur heard. Pulmonary/Chest: Effort normal and breath sounds normal. No accessory muscle usage. No tachypnea. No respiratory distress. He has no decreased breath sounds. He has no wheezes. He has no rhonchi. He has no rales. He exhibits no tenderness.  Musculoskeletal: Normal range of motion. He exhibits no edema.  Lymphadenopathy:    He has no cervical adenopathy.  Neurological: He is alert and oriented to person, place, and time. No cranial nerve deficit. Coordination normal.  Skin: Skin is warm and dry. No rash noted. He is not diaphoretic. No erythema. No pallor.  Psychiatric: He has a normal mood and affect. His behavior is normal. Judgment and thought content normal.          Assessment & Plan:   Problem List Items Addressed This Visit      Unprioritized   Bicuspid aortic valve    Reviewed notes from CT surgery. Question if he may need CABG for proximal LAD lesion along with AVR. Message  sent to his cardiologist.      Chest pain - Primary    Left sided chest pain at rest and on exertion. Known 50-60% proximal LAD lesion. Question if he may need CABG with AVR. Message sent to his cardiologist.        Other Visit Diagnoses    Hyperlipidemia            Return in about 4 weeks (around 05/15/2015) for Recheck.  Ronna Polio, MD Internal Medicine [facility]

## 2015-04-17 NOTE — Assessment & Plan Note (Signed)
Reviewed notes from CT surgery. Question if he may need CABG for proximal LAD lesion along with AVR. Message sent to his cardiologist.

## 2015-04-17 NOTE — Progress Notes (Signed)
Pre visit review using our clinic review tool, if applicable. No additional management support is needed unless otherwise documented below in the visit note. 

## 2015-04-17 NOTE — Assessment & Plan Note (Signed)
Left sided chest pain at rest and on exertion. Known 50-60% proximal LAD lesion. Question if he may need CABG with AVR. Message sent to his cardiologist.

## 2015-04-18 ENCOUNTER — Encounter: Payer: Self-pay | Admitting: Cardiovascular Disease

## 2015-04-18 ENCOUNTER — Ambulatory Visit (INDEPENDENT_AMBULATORY_CARE_PROVIDER_SITE_OTHER): Payer: BLUE CROSS/BLUE SHIELD | Admitting: Cardiovascular Disease

## 2015-04-18 VITALS — BP 104/80 | HR 85 | Ht 70.0 in | Wt 199.8 lb

## 2015-04-18 DIAGNOSIS — R011 Cardiac murmur, unspecified: Secondary | ICD-10-CM | POA: Diagnosis not present

## 2015-04-18 LAB — HEPATIC FUNCTION PANEL
ALK PHOS: 103 IU/L (ref 39–117)
ALT: 30 IU/L (ref 0–44)
AST: 26 IU/L (ref 0–40)
Albumin: 4.4 g/dL (ref 3.5–5.5)
BILIRUBIN TOTAL: 0.4 mg/dL (ref 0.0–1.2)
BILIRUBIN, DIRECT: 0.13 mg/dL (ref 0.00–0.40)
Total Protein: 7.2 g/dL (ref 6.0–8.5)

## 2015-04-18 LAB — LIPID PANEL
CHOLESTEROL TOTAL: 154 mg/dL (ref 100–199)
Chol/HDL Ratio: 4.2 ratio units (ref 0.0–5.0)
HDL: 37 mg/dL — ABNORMAL LOW (ref >39)
LDL Calculated: 63 mg/dL (ref 0–99)
Triglycerides: 271 mg/dL — ABNORMAL HIGH (ref 0–149)
VLDL Cholesterol Cal: 54 mg/dL — ABNORMAL HIGH (ref 5–40)

## 2015-04-18 NOTE — Patient Instructions (Signed)
Medication Instructions:  Your physician recommends that you continue on your current medications as directed. Please refer to the Current Medication list given to you today.   Labwork: none  Testing/Procedures: none  Follow-Up: Your physician recommends that you schedule a follow-up appointment in: two months with Dr. Arida.    Any Other Special Instructions Will Be Listed Below (If Applicable).     If you need a refill on your cardiac medications before your next appointment, please call your pharmacy.   

## 2015-04-18 NOTE — Progress Notes (Signed)
Cardiology Office Note   Date:  04/18/2015   ID:  Nicholas BlalockJames W Siri ColeBrafford Jr., DOB [birth year], MRN [id number]  PCP:  Wynona DoveWALKER,JENNIFER AZBELL, MD  Cardiologist:   Lorine BearsMuhammad Arida, MD   Chief Complaint  Patient presents with  . Follow-up    heavier breathing, rapid heart beat  . Heart Murmur      History of Present Illness: Nicholas JourneyJames W Hallgren Jr. is a 55 y.o. male who presents for a follow-up visit regarding  ascending aortic aneurysm, bicuspid aortic valve and moderate nonobstructive one-vessel coronary artery disease. He was hospitalized briefly in January for atypical chest pain in the setting of a viral illness. He had an echocardiogram during the hospitalization and there was no comment about bicuspid aortic valve. However, I reviewed it personally and the aortic valve did appear bicuspid but without significant regurgitation or stenosis. Due to patient's continued symptoms of atypical chest pain,I scheduled him for CTA coronary arteries which showed a calcium score of 24, bicuspid aortic valve, ascending aortic aneurysm measuring 46 x 43 mm and moderate proximal LAD disease estimated to be 50-60%. The patient requested a surgical opinion regarding the timing of surgery. Thus, I referred him to Dr. Tyrone SageGerhardt who recommended surgery once the ascending aorta is 50 mm. The patient was referred by Dr. Dan HumphreysWalker due to continued chest pain. Upon talking with the patient, he reports brief chest pain at rest described as sharp discomfort lasting for only one or 2 minutes. The symptoms are usually not exertional and he is usually able to perform his job without significant limitations. He has been frustrated because we restricted his weight lifting to 20 pounds and most of the activities at work require him to carry up to 45 pounds.    Past Medical History  Diagnosis Date  . Hyperlipidemia     No past surgical history on file.   Current Outpatient Prescriptions  Medication Sig Dispense Refill  .  acetaminophen (TYLENOL) 325 MG tablet Take 650 mg by mouth every 6 (six) hours as needed for mild pain, moderate pain, fever or headache.    Marland Kitchen. aspirin 325 MG tablet Take 325 mg by mouth daily.    Marland Kitchen. atorvastatin (LIPITOR) 40 MG tablet Take 1 tablet (40 mg total) by mouth daily. 30 tablet 11  . Multiple Vitamin (MULTIVITAMIN) tablet Take 1 tablet by mouth daily.    . OMEGA-3 KRILL OIL PO Take by mouth.     No current facility-administered medications for this visit.    Allergies:   Motrin and Shellfish allergy    Social History:  The patient  reports that he has quit smoking. He does not have any smokeless tobacco history on file. He reports that he does not drink alcohol or use illicit drugs.   Family History:  The patient's family history includes COPD in his father; Cancer (age of onset: 1540) in his sister; Dementia in his mother.     PHYSICAL EXAM: VS:  BP 104/80 mmHg  Pulse 104  Ht 5\' 10"  (1.778 m)  Wt 199 lb 12.8 oz (90.629 kg)  BMI 28.67 kg/m2  SpO2 93% , BMI Body mass index is 28.67 kg/(m^2). GEN: Well nourished, well developed, in no acute distress HEENT: normal Neck: no JVD, carotid bruits, or masses Cardiac: RRR; no murmurs, rubs, or gallops,no edema  Respiratory:  clear to auscultation bilaterally, normal work of breathing GI: soft, nontender, nondistended, + BS MS: no deformity or atrophy Skin: warm and dry, no rash Neuro:  Strength  and sensation are intact Psych: euthymic mood, full affect   EKG:  EKG ordered today. EKG showed normal sinus rhythm with borderline LVH and prior inferior infarct which was present on previous EKG    Recent Labs: 01/24/2015: BUN 11; Creatinine, Ser 0.96; Hemoglobin 14.3; Platelets 293; Potassium 4.2; Sodium 139 04/17/2015: ALT 30    Lipid Panel    Component Value Date/Time   CHOL 154 04/17/2015 1528   CHOL 199 10/25/2013 0853   TRIG 271* 04/17/2015 1528   HDL 37* 04/17/2015 1528   HDL 39.60 10/25/2013 0853   CHOLHDL 4.2  04/17/2015 1528   CHOLHDL 5 10/25/2013 0853   VLDL 10.8 10/25/2013 0853   LDLCALC 63 04/17/2015 1528   LDLCALC 149* 10/25/2013 0853      Wt Readings from Last 3 Encounters:  04/18/15 199 lb 12.8 oz (90.629 kg)  04/17/15 199 lb 2 oz (90.323 kg)  04/06/15 195 lb (88.451 kg)      Other studies Reviewed: Additional studies/ records that were reviewed today include: echocardiogram, CTA of the coronary arteries.    ASSESSMENT AND PLAN:  1.  Coronary artery disease involving native coronary arteries without angina: recent CTA showed 50-60% proximal LAD disease for which I recommend aggressive medical therapy and lifestyle changes. He is to continue on daily aspirin. Continue treatment of risk factors. Regarding the current chest pain, his symptoms are completely atypical and seems to be musculoskeletal. He has no exertional symptoms and he has been able to do his physical activities and work without significant limitations. Thus, I do not think his symptoms represent angina. At the current stage, I don't think the patient would benefit from revascularization or surgical repair of his aneurysm. His biggest issue is the restrictions on his job. I reviewed the notes from Dr. Nydia Bouton and the patient was advised against competitive weightlifting but otherwise no restrictions. I modified his restrictions to no more than 50 pounds of lifting at work.  2.  Bicuspid aortic valve: with no significant stenosis or regurgitation. I will plan on following this with a yearly echocardiogram.  3. Ascending aortic aneurysm in the setting of bicuspid aortic valve: given the presence of bicuspid aortic valve, the threshold for surgical repair is 50 mm. He will require surgery once the aneurysm reaches 50 mm or his aortic valve becomes symptomatic with regurgitation stenosis. Will plan on repeating his CT of the aorta in 6 months.  4. Hyperlipidemia: He was started on atorvastatin with significant improvement in  LDL to 63. His triglycerides were elevated. He reports drinking at least 3 sodas a day and lots of sweet tea. I discussed with him the importance of decreasing carbohydrate intake.     Disposition:   FU with me in 3 months  Signed,  Lorine Bears, MD  04/18/2015 2:54 PM    [facility]

## 2015-06-09 ENCOUNTER — Ambulatory Visit: Payer: BLUE CROSS/BLUE SHIELD | Admitting: Cardiovascular Disease

## 2015-06-12 ENCOUNTER — Ambulatory Visit (INDEPENDENT_AMBULATORY_CARE_PROVIDER_SITE_OTHER): Payer: BLUE CROSS/BLUE SHIELD | Admitting: Cardiovascular Disease

## 2015-06-12 ENCOUNTER — Encounter: Payer: Self-pay | Admitting: Cardiovascular Disease

## 2015-06-12 VITALS — BP 130/90 | HR 58 | Ht 70.0 in | Wt 199.8 lb

## 2015-06-12 DIAGNOSIS — R079 Chest pain, unspecified: Secondary | ICD-10-CM | POA: Diagnosis not present

## 2015-06-12 DIAGNOSIS — I712 Thoracic aortic aneurysm, without rupture: Secondary | ICD-10-CM

## 2015-06-12 DIAGNOSIS — I7121 Aneurysm of the ascending aorta, without rupture: Secondary | ICD-10-CM

## 2015-06-12 NOTE — Patient Instructions (Addendum)
Medication Instructions:  Your physician recommends that you continue on your current medications as directed. Please refer to the Current Medication list given to you today.   Labwork: none  Testing/Procedures: Non-Cardiac CT Angiography (CTA), is a special type of CT scan that uses a computer to produce multi-dimensional views of major blood vessels throughout the body. In CT angiography, a contrast material is injected through an IV to help visualize the blood vessels  Thursday, October 5, 8:45am [facility]  Follow-Up: Your physician wants you to follow-up in: six months with Dr. Kirke CorinArida.  You will receive a reminder letter in the mail two months in advance. If you don't receive a letter, please call our office to schedule the follow-up appointment.   Any Other Special Instructions Will Be Listed Below (If Applicable).     If you need a refill on your cardiac medications before your next appointment, please call your pharmacy.

## 2015-06-12 NOTE — Progress Notes (Signed)
Cardiology Office Note   Date:  06/12/2015   ID:  Nicholas Blalock Siri Cole., DOB May 07, 1960, MRN [id number]  PCP:  Wynona Dove, MD  Cardiologist:   Lorine Bears, MD   Chief Complaint  Patient presents with  . OTHER    2 month F/U.  Patient C/O CP and Left Shoulder pain. Medications verbally reviewed with patient.       History of Present Illness: Nicholas Juarez. is a 55 y.o. male who presents for a follow-up visit regarding  ascending aortic aneurysm, bicuspid aortic valve and moderate nonobstructive one-vessel coronary artery disease. He was hospitalized briefly in January for atypical chest pain in the setting of a viral illness. He had an echocardiogram during the hospitalization and there was no comment about bicuspid aortic valve. However, I reviewed it personally and the aortic valve did appear bicuspid but without significant regurgitation or stenosis. Due to patient's continued symptoms of atypical chest pain,I scheduled him for CTA coronary arteries which showed a calcium score of 24, bicuspid aortic valve, ascending aortic aneurysm measuring 46 x 43 mm and moderate proximal LAD disease estimated to be 50-60%. The patient requested a surgical opinion regarding the timing of surgery. Thus, I referred him to Dr. Tyrone Sage who recommended surgery once the ascending aorta is 50 mm.He has been doing well overall with no recurrent chest pain or significant dyspnea. His blood pressure is mildly elevated today but he took that 2 tablets of ibuprofen last night.    Past Medical History  Diagnosis Date  . Hyperlipidemia     History reviewed. No pertinent past surgical history.   Current Outpatient Prescriptions  Medication Sig Dispense Refill  . acetaminophen (TYLENOL) 325 MG tablet Take 650 mg by mouth every 6 (six) hours as needed for mild pain, moderate pain, fever or headache.    Marland Kitchen aspirin 325 MG tablet Take 325 mg by mouth daily.    Marland Kitchen atorvastatin (LIPITOR) 40  MG tablet Take 1 tablet (40 mg total) by mouth daily. 30 tablet 11  . ibuprofen (ADVIL,MOTRIN) 200 MG tablet Take 800 mg by mouth as needed.     . Multiple Vitamin (MULTIVITAMIN) tablet Take 1 tablet by mouth daily.    . OMEGA-3 KRILL OIL PO Take by mouth.     No current facility-administered medications for this visit.    Allergies:   Motrin and Shellfish allergy    Social History:  The patient  reports that he has quit smoking. He does not have any smokeless tobacco history on file. He reports that he does not drink alcohol or use illicit drugs.   Family History:  The patient's family history includes COPD in his father; Cancer (age of onset: 30) in his sister; Dementia in his mother.     PHYSICAL EXAM: VS:  BP 130/90 mmHg  Pulse 58  Ht  (1.778 m)  Wt 199 lb 12.8 oz (90.629 kg)  BMI 28.67 kg/m2 , BMI Body mass index is 28.67 kg/(m^2). GEN: Well nourished, well developed, in no acute distress HEENT: normal Neck: no JVD, carotid bruits, or masses Cardiac: RRR; no murmurs, rubs, or gallops,no edema  Respiratory:  clear to auscultation bilaterally, normal work of breathing GI: soft, nontender, nondistended, + BS MS: no deformity or atrophy Skin: warm and dry, no rash Neuro:  Strength and sensation are intact Psych: euthymic mood, full affect   EKG:  EKG ordered today. EKG showed normal sinus rhythm with borderline LVH and prior inferior infarct  which was present on previous EKG    Recent Labs: 01/24/2015: BUN 11; Creatinine, Ser 0.96; Hemoglobin 14.3; Platelets 293; Potassium 4.2; Sodium 139 04/17/2015: ALT 30    Lipid Panel    Component Value Date/Time   CHOL 154 04/17/2015 1528   CHOL 199 10/25/2013 0853   TRIG 271* 04/17/2015 1528   HDL 37* 04/17/2015 1528   HDL 39.60 10/25/2013 0853   CHOLHDL 4.2 04/17/2015 1528   CHOLHDL 5 10/25/2013 0853   VLDL 10.8 10/25/2013 0853   LDLCALC 63 04/17/2015 1528   LDLCALC 149* 10/25/2013 0853      Wt Readings from  Last 3 Encounters:  06/12/15 199 lb 12.8 oz (90.629 kg)  04/18/15 199 lb 12.8 oz (90.629 kg)  04/17/15 199 lb 2 oz (90.323 kg)      Other studies Reviewed: Additional studies/ records that were reviewed today include: echocardiogram, CTA of the coronary arteries.    ASSESSMENT AND PLAN:  1.  Coronary artery disease involving native coronary arteries without angina:  He is doing reasonably well without anginal symptoms. Continue medical therapy.  2.  Bicuspid aortic valve: with no significant stenosis or regurgitation. I will plan on following this with a yearly echocardiogram.  3. Ascending aortic aneurysm in the setting of bicuspid aortic valve: given the presence of bicuspid aortic valve, the threshold for surgical repair is 50 mm. He will require surgery once the aneurysm reaches 50 mm or his aortic valve becomes symptomatic with regurgitation stenosis. I requested repeat CT angiogram of the chest to be done in October.   4. Hyperlipidemia:   continue treatment with atorvastatin. Most recent LDL was 63.    Disposition:   FU with me in 6 months  Signed,  Lorine BearsMuhammad Dominic Rhome, MD  06/12/2015 2:01 PM    [facility]

## 2015-06-20 ENCOUNTER — Ambulatory Visit: Payer: BLUE CROSS/BLUE SHIELD | Admitting: Cardiovascular Disease

## 2015-09-04 ENCOUNTER — Other Ambulatory Visit: Payer: Self-pay | Admitting: Cardiothoracic Surgery

## 2015-09-04 DIAGNOSIS — I7121 Aneurysm of the ascending aorta, without rupture: Secondary | ICD-10-CM

## 2015-09-04 DIAGNOSIS — I712 Thoracic aortic aneurysm, without rupture: Secondary | ICD-10-CM

## 2015-09-14 ENCOUNTER — Ambulatory Visit: Payer: BLUE CROSS/BLUE SHIELD | Admitting: Cardiothoracic Surgery

## 2015-09-19 ENCOUNTER — Ambulatory Visit: Payer: BLUE CROSS/BLUE SHIELD | Admitting: Family Medicine

## 2015-09-21 ENCOUNTER — Encounter: Payer: Self-pay | Admitting: Family Medicine

## 2015-09-21 ENCOUNTER — Encounter (INDEPENDENT_AMBULATORY_CARE_PROVIDER_SITE_OTHER): Payer: Self-pay

## 2015-09-21 ENCOUNTER — Telehealth: Payer: Self-pay | Admitting: Cardiovascular Disease

## 2015-09-21 ENCOUNTER — Ambulatory Visit (INDEPENDENT_AMBULATORY_CARE_PROVIDER_SITE_OTHER): Payer: BLUE CROSS/BLUE SHIELD | Admitting: Family Medicine

## 2015-09-21 DIAGNOSIS — I7121 Aneurysm of the ascending aorta, without rupture: Secondary | ICD-10-CM

## 2015-09-21 DIAGNOSIS — I25119 Atherosclerotic heart disease of native coronary artery with unspecified angina pectoris: Secondary | ICD-10-CM | POA: Diagnosis not present

## 2015-09-21 DIAGNOSIS — M6530 Trigger finger, unspecified finger: Secondary | ICD-10-CM | POA: Diagnosis not present

## 2015-09-21 DIAGNOSIS — E785 Hyperlipidemia, unspecified: Secondary | ICD-10-CM

## 2015-09-21 DIAGNOSIS — Q231 Congenital insufficiency of aortic valve: Secondary | ICD-10-CM | POA: Diagnosis not present

## 2015-09-21 DIAGNOSIS — I712 Thoracic aortic aneurysm, without rupture: Secondary | ICD-10-CM

## 2015-09-21 NOTE — Assessment & Plan Note (Signed)
Stable. Being followed closely via repeat imaging. No indication for repair at this time.

## 2015-09-21 NOTE — Telephone Encounter (Signed)
Per Dr. Kirke CorinArida, "I noticed that [facility]. If this gets done, then there is no need for the one we scheduled in October.   S/w pt wife, Steward DroneBrenda, ([place] as she states pt is unavailable. Informed wife of duplicate tests ordered. She was aware and states she was going to call us to cancel Oct test. Confirmed he will keep 9/21 CTA chest appt and Oct appt will be cancelled. She is agreeable w/plan w/no further questions.

## 2015-09-21 NOTE — Assessment & Plan Note (Signed)
Stable. Followed by cardiology. Yearly echo.

## 2015-09-21 NOTE — Progress Notes (Signed)
Subjective:  Patient ID: Nicholas Journey., male    DOB: [birth year]  Age: 55 y.o. MRN: [id number]  CC: Followup   HPI:  55 year old male with nonobstructive CAD, bicuspid aortic valve, and an ascedning aortic aneurysm presents for followup. He complains of muscle cramps and "hand locking" up today.  CAD  Patient reports frequent chest pain/discomfort.  He is followed by cardiology.  Compliant with Aspirin, Lipitor.  Bicuspid aortic valve, Ascending aortic aneurysm  Stable.  Being followed by cardiology and has seen cardiothoracic surgery. No indication for surgery regarding his aneurysm at this time.  HLD  Stable on Lipitor.  He does report muscle cramping which he attributes to the lipitor. He would like to discuss this today.  "Hand locking" up  Patient's ring finger often locks up or gets stuck.  He reports that this is mildly bothersome.  No known inciting factor.  No known exacerbating or relieving factors. No medications or interventions tried. No other complaint at this time.  Social Hx   Social History   Social History  . Marital status: Married    Spouse name: N/A  . Number of children: N/A  . Years of education: N/A   Social History Main Topics  . Smoking status: Former Games developer  . Smokeless tobacco: None  . Alcohol use No     Comment: seldom  . Drug use: No  . Sexual activity: Not Asked   Other Topics Concern  . None   Social History Narrative   Lives with wife in [place].    Work - Forensic scientist   Diet - regular   Exercise - none     Review of Systems  Constitutional: Negative.   Cardiovascular: Positive for chest pain.  Musculoskeletal: Positive for myalgias.   Objective:  BP 132/77 (BP Location: Left Arm, Patient Position: Sitting, Cuff Size: Normal)   Pulse (!) 55   Temp 97.7 F (36.5 C) (Oral)   Wt 197 lb 8 oz (89.6 kg)   SpO2 98%   BMI 28.34 kg/m   BP/Weight 09/21/2015 06/12/2015 04/18/2015  Systolic BP 132 130 104    Diastolic BP 77 90 80  Wt. (Lbs) 197.5 199.8 199.8  BMI 28.34 28.67 28.67   Physical Exam  Constitutional: He is oriented to person, place, and time. He appears well-developed. No distress.  Cardiovascular: Normal rate and regular rhythm.   Pulmonary/Chest: Effort normal. He has no wheezes. He has no rales.  Musculoskeletal:  Right hand - trigger finger noted (ring finger).  Neurological: He is alert and oriented to person, place, and time.  Psychiatric: He has a normal mood and affect.  Vitals reviewed.  Lab Results  Component Value Date   WBC 10.7 (H) 01/24/2015   HGB 14.3 01/24/2015   HCT 43.9 01/24/2015   PLT 293 01/24/2015   GLUCOSE 109 (H) 01/24/2015   CHOL 154 04/17/2015   TRIG 271 (H) 04/17/2015   HDL 37 (L) 04/17/2015   LDLCALC 63 04/17/2015   ALT 30 04/17/2015   AST 26 04/17/2015   NA 139 01/24/2015   K 4.2 01/24/2015   CL 104 01/24/2015   CREATININE 0.96 01/24/2015   BUN 11 01/24/2015   CO2 30 01/24/2015   TSH 2.44 10/23/2012   PSA 1.24 10/25/2013    Assessment & Plan:   Problem List Items Addressed This Visit    Ascending aortic aneurysm (HCC)    Stable. Being followed closely via repeat imaging. No indication for repair at this time.  Bicuspid aortic valve    Stable. Followed by cardiology. Yearly echo.      CAD (coronary artery disease)    Patient endorsing frequent chest pain. No pain today. Will forward to Cardiologist. Continue Aspirin and Statin.      Trigger finger    New problem. Advised supportive care and watchful waiting. If persist will inject or send to ortho for injection.      Hyperlipidemia    At goal. Continuing lipitor.       Other Visit Diagnoses   None.     Follow-up: 6 months.  Everlene OtherJayce Jorryn Hershberger DO [facility] [place]

## 2015-09-21 NOTE — Progress Notes (Signed)
Pre visit review using our clinic review tool, if applicable. No additional management support is needed unless otherwise documented below in the visit note. 

## 2015-09-21 NOTE — Assessment & Plan Note (Signed)
At goal. Continuing lipitor.

## 2015-09-21 NOTE — Assessment & Plan Note (Signed)
Patient endorsing frequent chest pain. No pain today. Will forward to Cardiologist. Continue Aspirin and Statin.

## 2015-09-21 NOTE — Assessment & Plan Note (Signed)
New problem. Advised supportive care and watchful waiting. If persist will inject or send to ortho for injection.

## 2015-09-21 NOTE — Patient Instructions (Signed)
Continue your current medications.  Follow up in 6 months.   Take care  Dr. Galit Urich  

## 2015-09-27 ENCOUNTER — Other Ambulatory Visit: Payer: Self-pay | Admitting: Cardiothoracic Surgery

## 2015-09-28 ENCOUNTER — Ambulatory Visit
Admission: RE | Admit: 2015-09-28 | Discharge: 2015-09-28 | Disposition: A | Discharge status: Home / Self Care | Payer: BLUE CROSS/BLUE SHIELD | Source: Ambulatory Visit | Attending: Cardiothoracic Surgery | Admitting: Cardiothoracic Surgery

## 2015-09-28 ENCOUNTER — Ambulatory Visit: Payer: BLUE CROSS/BLUE SHIELD | Admitting: Cardiothoracic Surgery

## 2015-09-28 DIAGNOSIS — I712 Thoracic aortic aneurysm, without rupture: Secondary | ICD-10-CM

## 2015-09-28 DIAGNOSIS — I7121 Aneurysm of the ascending aorta, without rupture: Secondary | ICD-10-CM

## 2015-09-28 MED ORDER — IOPAMIDOL (ISOVUE-370) INJECTION 76%
75.0000 mL | Freq: Once | INTRAVENOUS | Status: AC | PRN
  Administered 2015-09-28: 75 mL via INTRAVENOUS

## 2015-10-10 ENCOUNTER — Ambulatory Visit: Payer: BLUE CROSS/BLUE SHIELD | Admitting: Cardiothoracic Surgery

## 2015-10-11 ENCOUNTER — Telehealth: Payer: Self-pay | Admitting: Cardiovascular Disease

## 2015-10-11 NOTE — Telephone Encounter (Addendum)
Per Dr. Kirke CorinArida: "Please schedule Mr. Nicholas Juarez to see me this week. " Attempted to contact pt. No answer, no voice mail on cell phone. S/w wife, Steward DroneBrenda, who states pt can not make 10/6 appt as he works until [employer].  Forward to scheduling for another appt.  He is scheduled to see Dr. Morton PetersVan Tright, 10/9

## 2015-10-12 ENCOUNTER — Telehealth: Payer: Self-pay | Admitting: Cardiovascular Disease

## 2015-10-12 ENCOUNTER — Ambulatory Visit: Payer: BLUE CROSS/BLUE SHIELD

## 2015-10-12 NOTE — Telephone Encounter (Signed)
S/w pt wife Steward DroneBrenda ([place] as pt is at work and can not answer his phone. She is agreeable to pt appt 10/9, 1pm with Dr. Kirke CorinArida followed by 3pm w/Dr. Morton PetersVan Tright.

## 2015-10-13 ENCOUNTER — Ambulatory Visit: Payer: BLUE CROSS/BLUE SHIELD | Admitting: Cardiovascular Disease

## 2015-10-16 ENCOUNTER — Ambulatory Visit: Payer: BLUE CROSS/BLUE SHIELD | Admitting: Cardiovascular Disease

## 2015-10-16 ENCOUNTER — Encounter: Payer: Self-pay | Admitting: Cardiothoracic Surgery

## 2015-10-16 ENCOUNTER — Ambulatory Visit (INDEPENDENT_AMBULATORY_CARE_PROVIDER_SITE_OTHER): Payer: BLUE CROSS/BLUE SHIELD | Admitting: Cardiothoracic Surgery

## 2015-10-16 VITALS — BP 124/80 | HR 85 | Resp 18 | Ht 70.0 in | Wt 197.0 lb

## 2015-10-16 DIAGNOSIS — I712 Thoracic aortic aneurysm, without rupture: Secondary | ICD-10-CM

## 2015-10-16 DIAGNOSIS — I7121 Aneurysm of the ascending aorta, without rupture: Secondary | ICD-10-CM

## 2015-10-16 NOTE — Progress Notes (Signed)
PCP is Tommie SamsJayce G Cook, DO Referring Provider is Iran OuchArida, Muhammad A, MD  Chief Complaint  Patient presents with  . 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 Patient was initially seen 6 months ago for a 4.6 cm diameter fusiform ascending thoracic aortic aneurysm, at that time the patient was experiencing shortness of breath with exertion and exertional sharp left chest pain radiating to left arm. The patient had a cardiac CT scan by Dr. Kirke CorinArida and echocardiogram. Is felt to have a bicuspid aortic valve without aortic stenosis or aortic insufficiency. The LAD had a 50-60 percent stenosis by cardiac gated CT. Patient continues to have exertional symptoms of chest pain and shortness of breath which is  interfering with his job and his home life according to his wife. He denies resting symptoms.  Most recent CT scan of the chest shows no change in the fusiform ascending aneurysm which now measures 4.5 cm. There is no evidence of intramural hematoma penetrating ulceration or significant calcification.  The patient denies hypertension. The patient does admit to anxiety. He is not taking a beta blocker. Past Medical History:  Diagnosis Date  . Hyperlipidemia     No past surgical history on file.  Family History  Problem Relation Age of Onset  . Dementia Mother   . COPD Father   . Cancer Sister [id number]    breast    Social History Social History  Substance Use Topics  . Smoking status: Former Games developermoker  . Smokeless tobacco: Not on file  . Alcohol use No     Comment: seldom    Current Outpatient Prescriptions  Medication Sig Dispense Refill  . acetaminophen (TYLENOL) 325 MG tablet Take 650 mg by mouth every 6 (six) hours as needed for mild pain, moderate pain, fever or headache.    Marland Kitchen. aspirin 325 MG tablet Take 325 mg by mouth daily.    Marland Kitchen. atorvastatin (LIPITOR) 40 MG tablet Take 1 tablet (40 mg total) by mouth daily.  30 tablet 11  . Multiple Vitamin (MULTIVITAMIN) tablet Take 1 tablet by mouth daily.     No current facility-administered medications for this visit.     Allergies  Allergen Reactions  . Motrin [Ibuprofen] Other (See Comments)    Only high doses; skin tingling.  . Shellfish Allergy Other (See Comments)    Skin tingling.    Review of Systems        Review of Systems :  [ y ] = yes, [  ] = no        General :  Weight gain [   ]    Weight loss  [   ]  Fatigue [ yes ]  Fever [  ]  Chills  [  ]                                Weakness  [  ]           HEENT    Headache [  ]  Dizziness [  ]  Blurred vision [  ] Glaucoma  [  ]                          Nosebleeds [  ] Painful or loose teeth [ poor dentition ]  Cardiac :  Chest pain/ pressure [ yes ]  Resting SOB [  ] exertional SOB [ yes ]                        Orthopnea [  ]  Pedal edema  [  ]  Palpitations [  ] Syncope/presyncope [ ]                         Paroxysmal nocturnal dyspnea [  ]         Pulmonary : cough Mahler.Beck  ]  wheezing [  ]  Hemoptysis [  ] Sputum [  ] Snoring [  ]                              Pneumothorax [  ]  Sleep apnea [  ]        GI : Vomiting [  ]  Dysphagia [  ]  Melena  [  ]  Abdominal pain [  ] BRBPR [  ]              Heart burn [  ]  Constipation [  ] Diarrhea  [  ] Colonoscopy [   ]patient has had upper endoscopy, normal per report        GU : Hematuria [  ]  Dysuria [  ]  Nocturia [  ] UTI's [  ]        Vascular : Claudication [  ]  Rest pain [  ]  DVT [  ] Vein stripping [  ] leg ulcers [  ]                          TIA [  ] Stroke [  ]  Varicose veins [  ]        NEURO :  Headaches  [  ] Seizures [  ] Vision changes [  ] Paresthesias [  ]                                       Seizures [  ]        Musculoskeletal :  Arthritis [  ] Gout  [  ]  Back pain [  ]  Joint pain [  ]        Skin :  Rash [  ]  Melanoma [  ] Sores [  ]        Heme : Bleeding problems [  ]Clotting Disorders [  ] Anemia [   ]Blood Transfusion [ ]         Endocrine : Diabetes [  ] Heat or Cold intolerance [  ] Polyuria [  ]excessive thirst [ ]         Psych : Depression [  ]  Anxiety [ yes right-hand dominant ]  Psych hospitalizations [  ] Memory change [  ]                                               BP 124/80   Pulse 85   Resp 18  Ht 5\' 10"  (1.778 m)   Wt 197 lb (89.4 kg)   SpO2 99% Comment: ON RA  BMI 28.27 kg/m  Physical Exam      Physical Exam  General: Alert middle-aged Caucasian male well-developed no acute distress HEENT: Normocephalic pupils equal , dentition adequate Neck: Supple without JVD, adenopathy, or bruit Chest: Clear to auscultation, symmetrical breath sounds, no rhonchi, no tenderness             or deformity Cardiovascular: Regular rate and rhythm, no murmur, no gallop, peripheral pulses             palpable in all extremities Abdomen:  Soft, nontender, no palpable mass or organomegaly Extremities: Warm, well-perfused, no clubbing cyanosis edema or tenderness,              no venous stasis changes of the legs Rectal/GU: Deferred Neuro: Grossly non--focal and symmetrical throughout Skin: Clean and dry without rash or ulceration   Diagnostic Tests: CTA shows stable 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 This is not related to a 4.5 cm fusiform aneurysm.  Recommend that the patient be evaluated for ischemic heart disease with a formal workup including stress test and probable left heart catheterization. Plan: Continue to follow moderate ascending fusiform aneurysm 4.5 cm. Best therapy now is not surgery as risk for dissection is less than 1-2 percent. We'll see patient back in 6 months with repeat CTA.  [name], MD [facility] [phone]

## 2015-10-17 ENCOUNTER — Ambulatory Visit: Payer: BLUE CROSS/BLUE SHIELD | Admitting: Cardiothoracic Surgery

## 2015-10-19 ENCOUNTER — Ambulatory Visit: Payer: BLUE CROSS/BLUE SHIELD | Admitting: Cardiothoracic Surgery

## 2015-10-24 ENCOUNTER — Ambulatory Visit (INDEPENDENT_AMBULATORY_CARE_PROVIDER_SITE_OTHER): Payer: BLUE CROSS/BLUE SHIELD | Admitting: Cardiovascular Disease

## 2015-10-24 ENCOUNTER — Encounter: Payer: Self-pay | Admitting: Cardiovascular Disease

## 2015-10-24 VITALS — BP 130/80 | HR 61 | Ht 70.0 in | Wt 201.8 lb

## 2015-10-24 DIAGNOSIS — I25119 Atherosclerotic heart disease of native coronary artery with unspecified angina pectoris: Secondary | ICD-10-CM

## 2015-10-24 DIAGNOSIS — I712 Thoracic aortic aneurysm, without rupture: Secondary | ICD-10-CM

## 2015-10-24 DIAGNOSIS — Z01812 Encounter for preprocedural laboratory examination: Secondary | ICD-10-CM | POA: Diagnosis not present

## 2015-10-24 DIAGNOSIS — I7121 Aneurysm of the ascending aorta, without rupture: Secondary | ICD-10-CM

## 2015-10-24 DIAGNOSIS — E785 Hyperlipidemia, unspecified: Secondary | ICD-10-CM | POA: Diagnosis not present

## 2015-10-24 NOTE — Progress Notes (Signed)
  Cardiology Office Note   Date:  10/24/2015   ID:  Nicholas W Nawabi Jr., DOB 08/27/1960, MRN [id number]  PCP:  Jayce G Cook, DO  Cardiologist:   Muhammad Arida, MD   Chief Complaint  Patient presents with  . Heart Murmur  . Hyperlipidemia      History of Present Illness: Nicholas W Winslow Jr. is a 55 y.o. male who presents for a follow-up visit regarding  ascending aortic aneurysm, bicuspid aortic valve and moderate nonobstructive one-vessel coronary artery disease. He was hospitalized briefly in January for atypical chest pain in the setting of a viral illness. He had an echocardiogram during the hospitalization and there was no comment about bicuspid aortic valve. However, I reviewed it personally and the aortic valve did appear bicuspid but without significant regurgitation or stenosis. Due to patient's continued symptoms of atypical chest pain,I scheduled him for CTA coronary arteries which showed a calcium score of 24, bicuspid aortic valve, ascending aortic aneurysm measuring 46 x 43 mm and moderate proximal LAD disease estimated to be 50-60%. The patient requested a surgical opinion regarding the timing of surgery. He was seen by both Dr. Gerhardt in Dr. Van Trigt. Both of them recommended surgery once the sizes reaches 50 mm. However, the patient continues to be extremely concerned and he continues to complain of exertional and nonexertional chest pain which is affecting his ability to perform his work. He seems to be very frustrated and he is worried about rupture of the aneurysm.    Past Medical History:  Diagnosis Date  . Hyperlipidemia     History reviewed. No pertinent surgical history.   Current Outpatient Prescriptions  Medication Sig Dispense Refill  . acetaminophen (TYLENOL) 325 MG tablet Take 650 mg by mouth every 6 (six) hours as needed for mild pain, moderate pain, fever or headache.    . aspirin 325 MG tablet Take 325 mg by mouth daily.    . atorvastatin  (LIPITOR) 40 MG tablet Take 1 tablet (40 mg total) by mouth daily. 30 tablet 11  . Multiple Vitamin (MULTIVITAMIN) tablet Take 1 tablet by mouth daily.     No current facility-administered medications for this visit.     Allergies:   Motrin [ibuprofen] and Shellfish allergy    Social History:  The patient  reports that he has quit smoking. He has never used smokeless tobacco. He reports that he does not drink alcohol or use drugs.   Family History:  The patient's family history includes COPD in his father; Cancer (age of onset: 40) in his sister; Dementia in his mother.     PHYSICAL EXAM: VS:  BP 130/80   Pulse 61   Ht 5' 10" (1.778 m)   Wt 201 lb 12.8 oz (91.5 kg)   SpO2 96%   BMI 28.96 kg/m  , BMI Body mass index is 28.96 kg/m. GEN: Well nourished, well developed, in no acute distress  HEENT: normal  Neck: no JVD, carotid bruits, or masses Cardiac: RRR; no murmurs, rubs, or gallops,no edema  Respiratory:  clear to auscultation bilaterally, normal work of breathing GI: soft, nontender, nondistended, + BS MS: no deformity or atrophy  Skin: warm and dry, no rash Neuro:  Strength and sensation are intact Psych: euthymic mood, full affect   EKG:  EKG ordered today. EKG showed normal sinus rhythm with borderline LVH and prior inferior infarct which was present on previous EKG    Recent Labs: 01/24/2015: BUN 11; Creatinine, Ser 0.96; Hemoglobin   14.3; Platelets 293; Potassium 4.2; Sodium 139 04/17/2015: ALT 30    Lipid Panel    Component Value Date/Time   CHOL 154 04/17/2015 1528   TRIG 271 (H) 04/17/2015 1528   HDL 37 (L) 04/17/2015 1528   CHOLHDL 4.2 04/17/2015 1528   CHOLHDL 5 10/25/2013 0853   VLDL 10.8 10/25/2013 0853   LDLCALC 63 04/17/2015 1528      Wt Readings from Last 3 Encounters:  10/24/15 201 lb 12.8 oz (91.5 kg)  10/16/15 197 lb (89.4 kg)  09/21/15 197 lb 8 oz (89.6 kg)      Other studies Reviewed: Additional studies/ records that were reviewed  today include: echocardiogram, CTA of the coronary arteries.    ASSESSMENT AND PLAN:  1.  Coronary artery disease involving native coronary arteries With other forms of angina: The patient continues to have exertional and nonexertional chest pain with associated dyspnea. I doubt that the aortic aneurysm is giving him these symptoms. Given his continued symptoms, I have recommended proceeding with cardiac catheterization and possible coronary intervention. I discussed risks and benefits with the patient and his wife.   2.  Bicuspid aortic valve: with no significant stenosis or regurgitation. I will plan on following this with a yearly echocardiogram.  3. Ascending aortic aneurysm in the setting of bicuspid aortic valve: given the presence of bicuspid aortic valve, the threshold for surgical repair is 50 mm.  The patient is still not comfortable with the idea of not repairing the aneurysm and to the sizes reaches 50 mm. I explained to him that the risk of rupture is not high when the sizes below 50 mm. He is thinking of getting another opinion from [facility].   4. Hyperlipidemia:   continue treatment with atorvastatin. Most recent LDL was 63.    Disposition:   FU with me in 1 months  Signed,  Nicholas BearsMuhammad Arida, MD  10/24/2015 3:37 PM    [facility]

## 2015-10-24 NOTE — Telephone Encounter (Signed)
This encounter was created in error - please disregard.

## 2015-10-24 NOTE — Patient Instructions (Addendum)
Medication Instructions:  Your physician recommends that you continue on your current medications as directed. Please refer to the Current Medication list given to you today.   Labwork: BMET, CBC, PT/INR  Testing/Procedures: Your physician has requested that you have a cardiac catheterization. Cardiac catheterization is used to diagnose and/or treat various heart conditions. Doctors may recommend this procedure for a number of different reasons. The most common reason is to evaluate chest pain. Chest pain can be a symptom of coronary artery disease (CAD), and cardiac catheterization can show whether plaque is narrowing or blocking your heart's arteries. This procedure is also used to evaluate the valves, as well as measure the blood flow and oxygen levels in different parts of your heart. For further information please visit [email or web address] Please follow instruction sheet, as given.  [facility] Cardiac Cath Instructions   You are scheduled for a Cardiac Cath on: Monday, Oct 30  Please arrive at _______am on the day of your procedure  Please expect a call from our [facility] to pre-register you  Do not eat/drink anything after midnight  Someone will need to drive you home  It is recommended someone be with you for the first 24 hours after your procedure  Wear clothes that are easy to get on/off and wear slip on shoes if possible   Medications bring a current list of all medications with you  _xx__ You may take all of your medications the morning of your procedure with enough water to swallow safely     Day of your procedure: Arrive at the [facility] entrance.  Free valet service is available.  After entering the [facility] please check-in at the registration desk (1st desk on your right) to receive your armband. After receiving your armband someone will escort you to the cardiac cath/special procedures waiting area.  The usual length of stay after your  procedure is about 2 to 3 hours.  This can vary.  If you have any questions, please call our office at [phone], or you may call the cardiac cath lab at [facility] directly at [phone]   Follow-Up: Your physician recommends that you schedule a follow-up appointment in one month with Dr. Kirke Corin.    Any Other Special Instructions Will Be Listed Below (If Applicable).     If you need a refill on your cardiac medications before your next appointment, please call your pharmacy.   Angiogram An angiogram, also called angiography, is a procedure used to look at the blood vessels. In this procedure, dye is injected through a long, thin tube (catheter) into an artery. X-rays are then taken. The X-rays will show if there is a blockage or problem in a blood vessel.  LET [facility] CARE PROVIDER KNOW ABOUT:  Any allergies you have, including allergies to shellfish or contrast dye.   All medicines you are taking, including vitamins, herbs, eye drops, creams, and over-the-counter medicines.   Previous problems you or members of your family have had with the use of anesthetics.   Any blood disorders you have.   Previous surgeries you have had.  Any previous kidney problems or failure you have had.  Medical conditions you have.   Possibility of pregnancy, if this applies. RISKS AND COMPLICATIONS Generally, an angiogram is a safe procedure. However, as with any procedure, problems can occur. Possible problems include:  Injury to the blood vessels, including rupture or bleeding.  Infection or bruising at the catheter site.  Allergic reaction to the dye or  contrast used.  Kidney damage from the dye or contrast used.  Blood clots that can lead to a stroke or heart attack. BEFORE THE PROCEDURE  Do not eat or drink after midnight on the night before the procedure, or as directed by your health care provider.   Ask your health care provider if you may drink enough water to take  any needed medicines the morning of the procedure.  PROCEDURE  You may be given a medicine to help you relax (sedative) before and during the procedure. This medicine is given through an IV access tube that is inserted into one of your veins.   The area where the catheter will be inserted will be washed and shaved. This is usually done in the groin but may be done in the fold of your arm (near your elbow) or in the wrist.  A medicine will be given to numb the area where the catheter will be inserted (local anesthetic).  The catheter will be inserted with a guide wire into an artery. The catheter is guided by using a type of X-ray (fluoroscopy) to the blood vessel being examined.   Dye is then injected into the catheter, and X-rays are taken. The dye helps to show where any narrowing or blockages are located.  AFTER THE PROCEDURE   If the procedure is done through the leg, you will be kept in bed lying flat for several hours. You will be instructed to not bend or cross your legs.  The insertion site will be checked frequently.  The pulse in your feet or wrist will be checked frequently.  Additional blood tests, X-rays, and electrocardiography may be done.   You may need to stay in the hospital overnight for observation.    This information is not intended to replace advice given to you by your health care provider. Make sure you discuss any questions you have with your health care provider.   Document Released: 10/03/2004 Document Revised: 01/14/2014 Document Reviewed: 05/27/2012 Elsevier Interactive Patient Education [employer]2016 Elsevier Inc.

## 2015-10-24 NOTE — Telephone Encounter (Signed)
10/30 cardiac cath scheduled for 10:30am Notified pt who understands to arrive at 9:30am He had no further questions at this time.

## 2015-10-25 ENCOUNTER — Telehealth: Payer: Self-pay | Admitting: Cardiovascular Disease

## 2015-10-25 LAB — CBC
HEMATOCRIT: 43.8 % (ref 37.5–51.0)
Hemoglobin: 15 g/dL (ref 12.6–17.7)
MCH: 29.7 pg (ref 26.6–33.0)
MCHC: 34.2 g/dL (ref 31.5–35.7)
MCV: 87 fL (ref 79–97)
Platelets: 340 10*3/uL (ref 150–379)
RBC: 5.05 x10E6/uL (ref 4.14–5.80)
RDW: 13.5 % (ref 12.3–15.4)
WBC: 8.2 10*3/uL (ref 3.4–10.8)

## 2015-10-25 LAB — BASIC METABOLIC PANEL
BUN / CREAT RATIO: 11 (ref 9–20)
BUN: 10 mg/dL (ref 6–24)
CO2: 25 mmol/L (ref 18–29)
CREATININE: 0.92 mg/dL (ref 0.76–1.27)
Calcium: 9.6 mg/dL (ref 8.7–10.2)
Chloride: 100 mmol/L (ref 96–106)
GFR calc Af Amer: 109 mL/min/{1.73_m2} (ref >59)
GFR, EST NON AFRICAN AMERICAN: 94 mL/min/{1.73_m2} (ref >59)
GLUCOSE: 127 mg/dL — AB (ref 65–99)
Potassium: 4.3 mmol/L (ref 3.5–5.2)
Sodium: 141 mmol/L (ref 134–144)

## 2015-10-25 LAB — PROTIME-INR
INR: 0.9 (ref 0.8–1.2)
Prothrombin Time: 10.1 s (ref 9.1–12.0)

## 2015-10-25 NOTE — Telephone Encounter (Signed)
Informed pt of 9:30am arrival for 10/30 cardiac cath who verbalized understanding.

## 2015-11-03 ENCOUNTER — Telehealth: Payer: Self-pay | Admitting: Cardiovascular Disease

## 2015-11-03 NOTE — Telephone Encounter (Signed)
Reviewed cardiac catheter instructions w/pt who verbalized understanding. He has no questions at this time.

## 2015-11-06 ENCOUNTER — Encounter: Payer: Self-pay | Admitting: *Deleted

## 2015-11-06 ENCOUNTER — Encounter
Admission: RE | Disposition: A | Discharge status: Home / Self Care | Payer: Self-pay | Source: Ambulatory Visit | Attending: Cardiovascular Disease

## 2015-11-06 ENCOUNTER — Ambulatory Visit
Admission: RE | Admit: 2015-11-06 | Discharge: 2015-11-06 | Disposition: A | Discharge status: Home / Self Care | Payer: BLUE CROSS/BLUE SHIELD | Source: Ambulatory Visit | Attending: Cardiovascular Disease | Admitting: Cardiovascular Disease

## 2015-11-06 DIAGNOSIS — Z87891 Personal history of nicotine dependence: Secondary | ICD-10-CM | POA: Insufficient documentation

## 2015-11-06 DIAGNOSIS — I2510 Atherosclerotic heart disease of native coronary artery without angina pectoris: Secondary | ICD-10-CM | POA: Insufficient documentation

## 2015-11-06 DIAGNOSIS — Z79899 Other long term (current) drug therapy: Secondary | ICD-10-CM | POA: Insufficient documentation

## 2015-11-06 DIAGNOSIS — R0789 Other chest pain: Secondary | ICD-10-CM | POA: Diagnosis not present

## 2015-11-06 DIAGNOSIS — Z7982 Long term (current) use of aspirin: Secondary | ICD-10-CM | POA: Diagnosis not present

## 2015-11-06 DIAGNOSIS — E785 Hyperlipidemia, unspecified: Secondary | ICD-10-CM | POA: Insufficient documentation

## 2015-11-06 DIAGNOSIS — I25119 Atherosclerotic heart disease of native coronary artery with unspecified angina pectoris: Secondary | ICD-10-CM | POA: Diagnosis not present

## 2015-11-06 DIAGNOSIS — Q231 Congenital insufficiency of aortic valve: Secondary | ICD-10-CM | POA: Insufficient documentation

## 2015-11-06 DIAGNOSIS — I712 Thoracic aortic aneurysm, without rupture: Secondary | ICD-10-CM | POA: Diagnosis not present

## 2015-11-06 HISTORY — PX: CARDIAC CATHETERIZATION: SHX172

## 2015-11-06 SURGERY — LEFT HEART CATH AND CORONARY ANGIOGRAPHY
Anesthesia: Moderate Sedation | Laterality: Left

## 2015-11-06 MED ORDER — METHYLPREDNISOLONE SODIUM SUCC 125 MG IJ SOLR: 125.0000 mg | Freq: Once | INTRAMUSCULAR | Status: AC

## 2015-11-06 MED ORDER — VERAPAMIL HCL 2.5 MG/ML IV SOLN
INTRAVENOUS | Status: DC | PRN
  Administered 2015-11-06: 2.5 mg via INTRA_ARTERIAL

## 2015-11-06 MED ORDER — HEPARIN (PORCINE) IN NACL 2-0.9 UNIT/ML-% IJ SOLN
INTRAMUSCULAR | Status: AC
  Filled 2015-11-06: qty 500

## 2015-11-06 MED ORDER — MIDAZOLAM HCL 2 MG/2ML IJ SOLN
INTRAMUSCULAR | Status: AC
  Filled 2015-11-06: qty 2

## 2015-11-06 MED ORDER — SODIUM CHLORIDE 0.9 % WEIGHT BASED INFUSION: 1.0000 mL/kg/h | INTRAVENOUS | Status: DC

## 2015-11-06 MED ORDER — SODIUM CHLORIDE 0.9 % WEIGHT BASED INFUSION: 3.0000 mL/kg/h | INTRAVENOUS | Status: DC

## 2015-11-06 MED ORDER — METHYLPREDNISOLONE SODIUM SUCC 125 MG IJ SOLR
INTRAMUSCULAR | Status: AC
  Administered 2015-11-06: 125 mg via INTRAVENOUS
  Filled 2015-11-06: qty 2

## 2015-11-06 MED ORDER — FENTANYL CITRATE (PF) 100 MCG/2ML IJ SOLN
INTRAMUSCULAR | Status: DC | PRN
  Administered 2015-11-06 (×2): 25 ug via INTRAVENOUS

## 2015-11-06 MED ORDER — IOPAMIDOL (ISOVUE-300) INJECTION 61%
INTRAVENOUS | Status: DC | PRN
  Administered 2015-11-06: 105 mL via INTRA_ARTERIAL

## 2015-11-06 MED ORDER — FAMOTIDINE 20 MG PO TABS
ORAL_TABLET | ORAL | Status: AC
  Administered 2015-11-06: 40 mg via ORAL
  Filled 2015-11-06: qty 2

## 2015-11-06 MED ORDER — HEPARIN SODIUM (PORCINE) 1000 UNIT/ML IJ SOLN
INTRAMUSCULAR | Status: DC | PRN
  Administered 2015-11-06: 4500 [IU] via INTRAVENOUS

## 2015-11-06 MED ORDER — FAMOTIDINE 20 MG PO TABS: 40.0000 mg | ORAL_TABLET | Freq: Once | ORAL | Status: AC

## 2015-11-06 MED ORDER — MIDAZOLAM HCL 2 MG/2ML IJ SOLN
INTRAMUSCULAR | Status: DC | PRN
  Administered 2015-11-06 (×2): 1 mg via INTRAVENOUS

## 2015-11-06 MED ORDER — FENTANYL CITRATE (PF) 100 MCG/2ML IJ SOLN
INTRAMUSCULAR | Status: AC
  Filled 2015-11-06: qty 2

## 2015-11-06 MED ORDER — HEPARIN SODIUM (PORCINE) 1000 UNIT/ML IJ SOLN
INTRAMUSCULAR | Status: AC
  Filled 2015-11-06: qty 1

## 2015-11-06 MED ORDER — VERAPAMIL HCL 2.5 MG/ML IV SOLN
INTRAVENOUS | Status: AC
  Filled 2015-11-06: qty 2

## 2015-11-06 SURGICAL SUPPLY — 7 items
CATH INFINITI 5FR ANG PIGTAIL (CATHETERS) ×2 IMPLANT
CATH OPTITORQUE JACKY 4.0 5F (CATHETERS) ×2 IMPLANT
DEVICE RAD TR BAND REGULAR (VASCULAR PRODUCTS) ×2 IMPLANT
GLIDESHEATH SLEND SS 6F .021 (SHEATH) ×2 IMPLANT
KIT MANI 3VAL PERCEP (MISCELLANEOUS) ×2 IMPLANT
PACK CARDIAC CATH (CUSTOM PROCEDURE TRAY) ×2 IMPLANT
WIRE SAFE-T 1.5MM-J .035X260CM (WIRE) ×2 IMPLANT

## 2015-11-06 NOTE — H&P (View-Only) (Signed)
Cardiology Office Note   Date:  10/24/2015   ID:  Nicholas JourneyJames W Weida Jr., DOB 26-Mar-1960, MRN [id number]  PCP:  Tommie SamsJayce G Cook, DO  Cardiologist:   Lorine BearsMuhammad Melody Savidge, MD   Chief Complaint  Patient presents with  . Heart Murmur  . Hyperlipidemia      History of Present Illness: Nicholas JourneyJames W Mcglory Jr. is a 55 y.o. male who presents for a follow-up visit regarding  ascending aortic aneurysm, bicuspid aortic valve and moderate nonobstructive one-vessel coronary artery disease. He was hospitalized briefly in January for atypical chest pain in the setting of a viral illness. He had an echocardiogram during the hospitalization and there was no comment about bicuspid aortic valve. However, I reviewed it personally and the aortic valve did appear bicuspid but without significant regurgitation or stenosis. Due to patient's continued symptoms of atypical chest pain,I scheduled him for CTA coronary arteries which showed a calcium score of 24, bicuspid aortic valve, ascending aortic aneurysm measuring 46 x 43 mm and moderate proximal LAD disease estimated to be 50-60%. The patient requested a surgical opinion regarding the timing of surgery. He was seen by both Dr. Tyrone SageGerhardt in Dr. Donata ClayVan Trigt. Both of them recommended surgery once the sizes reaches 50 mm. However, the patient continues to be extremely concerned and he continues to complain of exertional and nonexertional chest pain which is affecting his ability to perform his work. He seems to be very frustrated and he is worried about rupture of the aneurysm.    Past Medical History:  Diagnosis Date  . Hyperlipidemia     History reviewed. No pertinent surgical history.   Current Outpatient Prescriptions  Medication Sig Dispense Refill  . acetaminophen (TYLENOL) 325 MG tablet Take 650 mg by mouth every 6 (six) hours as needed for mild pain, moderate pain, fever or headache.    Marland Kitchen. aspirin 325 MG tablet Take 325 mg by mouth daily.    Marland Kitchen. atorvastatin  (LIPITOR) 40 MG tablet Take 1 tablet (40 mg total) by mouth daily. 30 tablet 11  . Multiple Vitamin (MULTIVITAMIN) tablet Take 1 tablet by mouth daily.     No current facility-administered medications for this visit.     Allergies:   Motrin [ibuprofen] and Shellfish allergy    Social History:  The patient  reports that he has quit smoking. He has never used smokeless tobacco. He reports that he does not drink alcohol or use drugs.   Family History:  The patient's family history includes COPD in his father; Cancer (age of onset: 9840) in his sister; Dementia in his mother.     PHYSICAL EXAM: VS:  BP 130/80   Pulse 61   Ht 5\' 10"  (1.778 m)   Wt 201 lb 12.8 oz (91.5 kg)   SpO2 96%   BMI 28.96 kg/m  , BMI Body mass index is 28.96 kg/m. GEN: Well nourished, well developed, in no acute distress  HEENT: normal  Neck: no JVD, carotid bruits, or masses Cardiac: RRR; no murmurs, rubs, or gallops,no edema  Respiratory:  clear to auscultation bilaterally, normal work of breathing GI: soft, nontender, nondistended, + BS MS: no deformity or atrophy  Skin: warm and dry, no rash Neuro:  Strength and sensation are intact Psych: euthymic mood, full affect   EKG:  EKG ordered today. EKG showed normal sinus rhythm with borderline LVH and prior inferior infarct which was present on previous EKG    Recent Labs: 01/24/2015: BUN 11; Creatinine, Ser 0.96; Hemoglobin  14.3; Platelets 293; Potassium 4.2; Sodium 139 04/17/2015: ALT 30    Lipid Panel    Component Value Date/Time   CHOL 154 04/17/2015 1528   TRIG 271 (H) 04/17/2015 1528   HDL 37 (L) 04/17/2015 1528   CHOLHDL 4.2 04/17/2015 1528   CHOLHDL 5 10/25/2013 0853   VLDL 10.8 10/25/2013 0853   LDLCALC 63 04/17/2015 1528      Wt Readings from Last 3 Encounters:  10/24/15 201 lb 12.8 oz (91.5 kg)  10/16/15 197 lb (89.4 kg)  09/21/15 197 lb 8 oz (89.6 kg)      Other studies Reviewed: Additional studies/ records that were reviewed  today include: echocardiogram, CTA of the coronary arteries.    ASSESSMENT AND PLAN:  1.  Coronary artery disease involving native coronary arteries With other forms of angina: The patient continues to have exertional and nonexertional chest pain with associated dyspnea. I doubt that the aortic aneurysm is giving him these symptoms. Given his continued symptoms, I have recommended proceeding with cardiac catheterization and possible coronary intervention. I discussed risks and benefits with the patient and his wife.   2.  Bicuspid aortic valve: with no significant stenosis or regurgitation. I will plan on following this with a yearly echocardiogram.  3. Ascending aortic aneurysm in the setting of bicuspid aortic valve: given the presence of bicuspid aortic valve, the threshold for surgical repair is 50 mm.  The patient is still not comfortable with the idea of not repairing the aneurysm and to the sizes reaches 50 mm. I explained to him that the risk of rupture is not high when the sizes below 50 mm. He is thinking of getting another opinion from [facility].   4. Hyperlipidemia:   continue treatment with atorvastatin. Most recent LDL was 63.    Disposition:   FU with me in 1 months  Signed,  Lorine BearsMuhammad Tymon Nemetz, MD  10/24/2015 3:37 PM    [facility]

## 2015-11-06 NOTE — Addendum Note (Signed)
Addended by: Awilda BillARDEN, Charls Custer J on: 11/06/2015 08:48 AM   Modules accepted: Orders

## 2015-11-06 NOTE — Interval H&P Note (Signed)
History and Physical Interval Note:  11/06/2015 10:51 AM  Len BlalockJames W Siri ColeBrafford Jr.  has presented today for surgery, with the diagnosis of LT Cath   Chest pain  CAD  The various methods of treatment have been discussed with the patient and family. After consideration of risks, benefits and other options for treatment, the patient has consented to  Procedure(s): Left Heart Cath and Coronary Angiography (Left) as a surgical intervention .  The patient's history has been reviewed, patient examined, no change in status, stable for surgery.  I have reviewed the patient's chart and labs.  Questions were answered to the patient's satisfaction.     Lorine BearsMuhammad Alisea Matte

## 2015-11-06 NOTE — Discharge Instructions (Signed)
Keep right wrist elevated on pillow above the heart for today.  Watch right wrist for evidence of bleeding or hematoma.. If bleeding or hematoma noted, hold pressure over the site for at least 15 minutes and notify the physician.  No blending or flexing of the wrist--no lifting for the remainder of the day or for 2 weeks after your procedure. Notify the physician for evidence of infection or if you get a temperature. The drugs you were given will stay in your system until tomorrow, so for the next 24 hours you should not.  Drive an automobile. Make any legal decisions.  Drink any alcoholic beverages. ° °Today you should start with liquids and gradually work up to solid foods as your are able to tolerate them ° °Resume your regular medications as prescribed by your doctor.  Avoid aspirin for 24 hours.   ° °Change the Band-Aid or dressing as needed.  After a 2 days no dressing as needed. ° °Avoid strenuous activity for the remainder of the day. ° °Please notify your primary physician immediately if you have any unusual bleeding, trouble breathing, fever >100 degrees or pain not relieved by the medication your doctor prescribed for your doctor prescribed for you physician ° ° °

## 2015-11-16 ENCOUNTER — Telehealth: Payer: Self-pay | Admitting: Cardiovascular Disease

## 2015-11-16 NOTE — Telephone Encounter (Signed)
Patient daughter Kennith Centerracey brought in FMLA forms from [employer].  Scanned forms and mailed to [employer].  Patient daughter given [employer] packet and ROI to have patient sign and return to [employer] with $25 fee.

## 2015-11-28 ENCOUNTER — Ambulatory Visit: Payer: BLUE CROSS/BLUE SHIELD | Admitting: Cardiovascular Disease

## 2015-11-29 ENCOUNTER — Encounter: Payer: Self-pay | Admitting: Emergency Medicine

## 2015-11-29 ENCOUNTER — Emergency Department
Admission: EM | Admit: 2015-11-29 | Discharge: 2015-11-30 | Disposition: A | Discharge status: Home / Self Care | Payer: BLUE CROSS/BLUE SHIELD | Attending: Emergency Medicine | Admitting: Emergency Medicine

## 2015-11-29 ENCOUNTER — Emergency Department: Payer: BLUE CROSS/BLUE SHIELD

## 2015-11-29 ENCOUNTER — Other Ambulatory Visit: Payer: Self-pay

## 2015-11-29 DIAGNOSIS — Z87891 Personal history of nicotine dependence: Secondary | ICD-10-CM | POA: Insufficient documentation

## 2015-11-29 DIAGNOSIS — Z7982 Long term (current) use of aspirin: Secondary | ICD-10-CM | POA: Diagnosis not present

## 2015-11-29 DIAGNOSIS — I25119 Atherosclerotic heart disease of native coronary artery with unspecified angina pectoris: Secondary | ICD-10-CM | POA: Diagnosis not present

## 2015-11-29 DIAGNOSIS — R0602 Shortness of breath: Secondary | ICD-10-CM | POA: Diagnosis not present

## 2015-11-29 DIAGNOSIS — Z79899 Other long term (current) drug therapy: Secondary | ICD-10-CM | POA: Insufficient documentation

## 2015-11-29 DIAGNOSIS — R079 Chest pain, unspecified: Secondary | ICD-10-CM | POA: Diagnosis not present

## 2015-11-29 DIAGNOSIS — R0789 Other chest pain: Secondary | ICD-10-CM | POA: Diagnosis present

## 2015-11-29 LAB — BASIC METABOLIC PANEL
ANION GAP: 6 (ref 5–15)
BUN: 13 mg/dL (ref 6–20)
CHLORIDE: 102 mmol/L (ref 101–111)
CO2: 29 mmol/L (ref 22–32)
Calcium: 9.1 mg/dL (ref 8.9–10.3)
Creatinine, Ser: 1.2 mg/dL (ref 0.61–1.24)
GFR calc non Af Amer: >60 mL/min (ref >60)
Glucose, Bld: 111 mg/dL — ABNORMAL HIGH (ref 65–99)
Potassium: 3.7 mmol/L (ref 3.5–5.1)
Sodium: 137 mmol/L (ref 135–145)

## 2015-11-29 LAB — CBC
HEMATOCRIT: 42.5 % (ref 40.0–52.0)
HEMOGLOBIN: 14.1 g/dL (ref 13.0–18.0)
MCH: 29.9 pg (ref 26.0–34.0)
MCHC: 33.2 g/dL (ref 32.0–36.0)
MCV: 90 fL (ref 80.0–100.0)
Platelets: 301 10*3/uL (ref 150–440)
RBC: 4.72 MIL/uL (ref 4.40–5.90)
RDW: 13.6 % (ref 11.5–14.5)
WBC: 11.4 10*3/uL — ABNORMAL HIGH (ref 3.8–10.6)

## 2015-11-29 LAB — TROPONIN I: Troponin I: <0.03 ng/mL (ref <0.03)

## 2015-11-29 IMAGING — CR DG CHEST 2V
3 series · 3 of 3 positions shown · non-contrast
Comparison: [DATE] chest CT

CLINICAL DATA: Chest pain

EXAM:
CHEST  2 VIEW

[chest pa (1 of 2)]
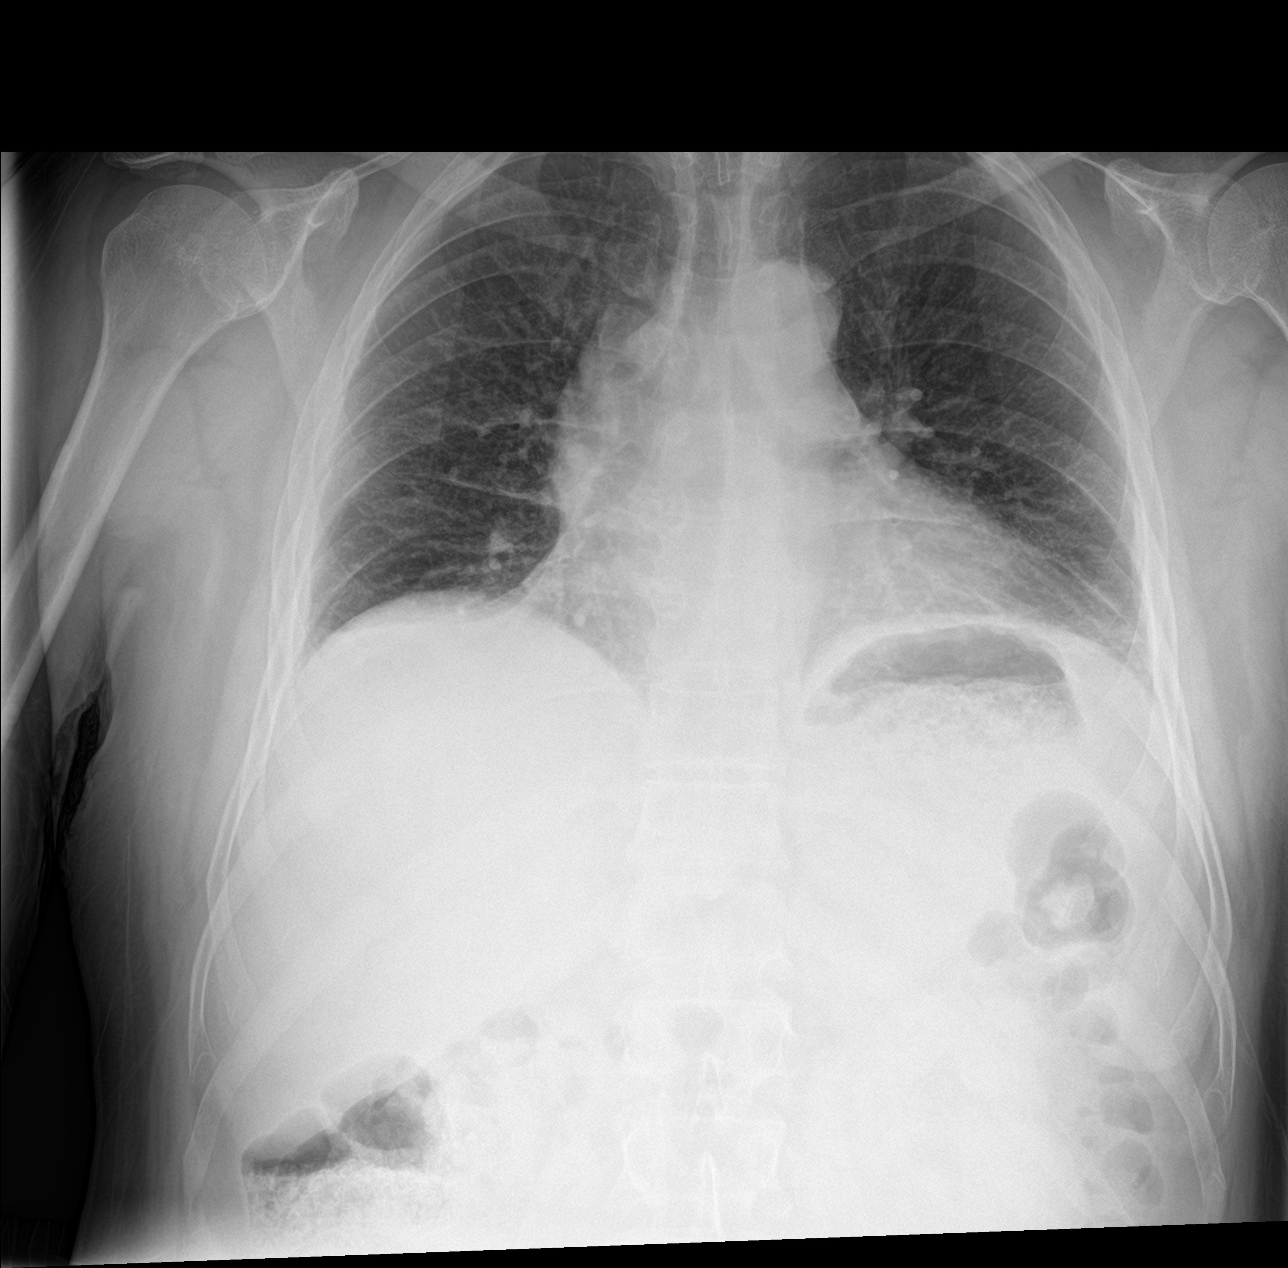

[chest lat]
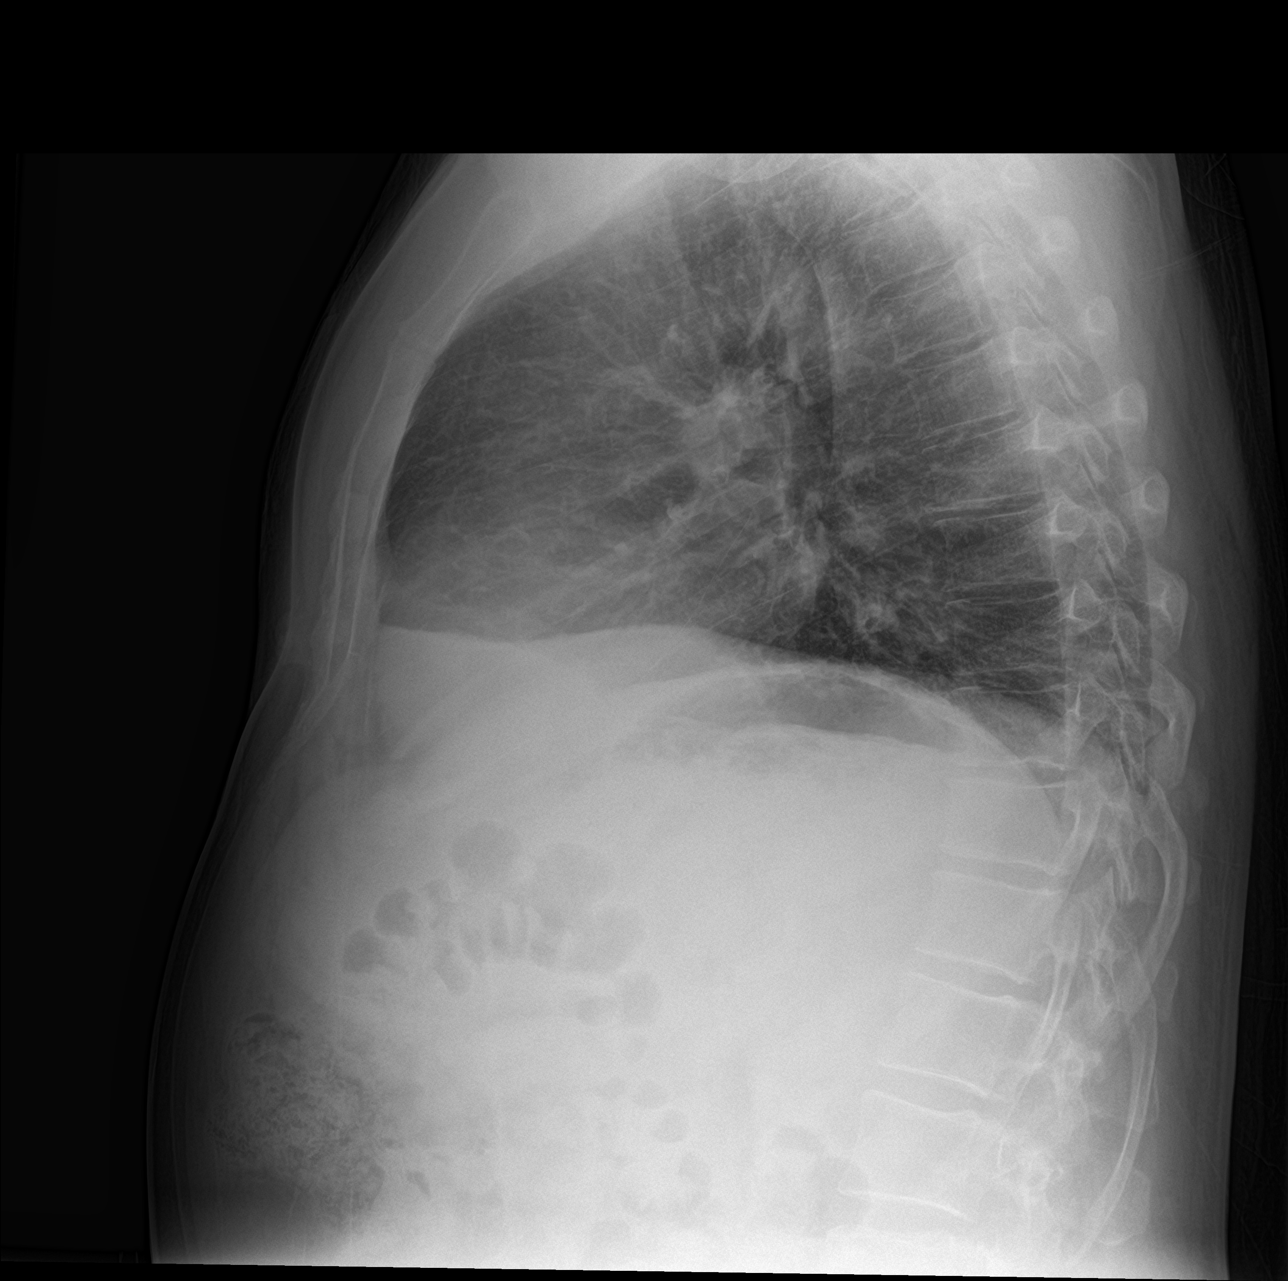

[chest pa (2 of 2)]
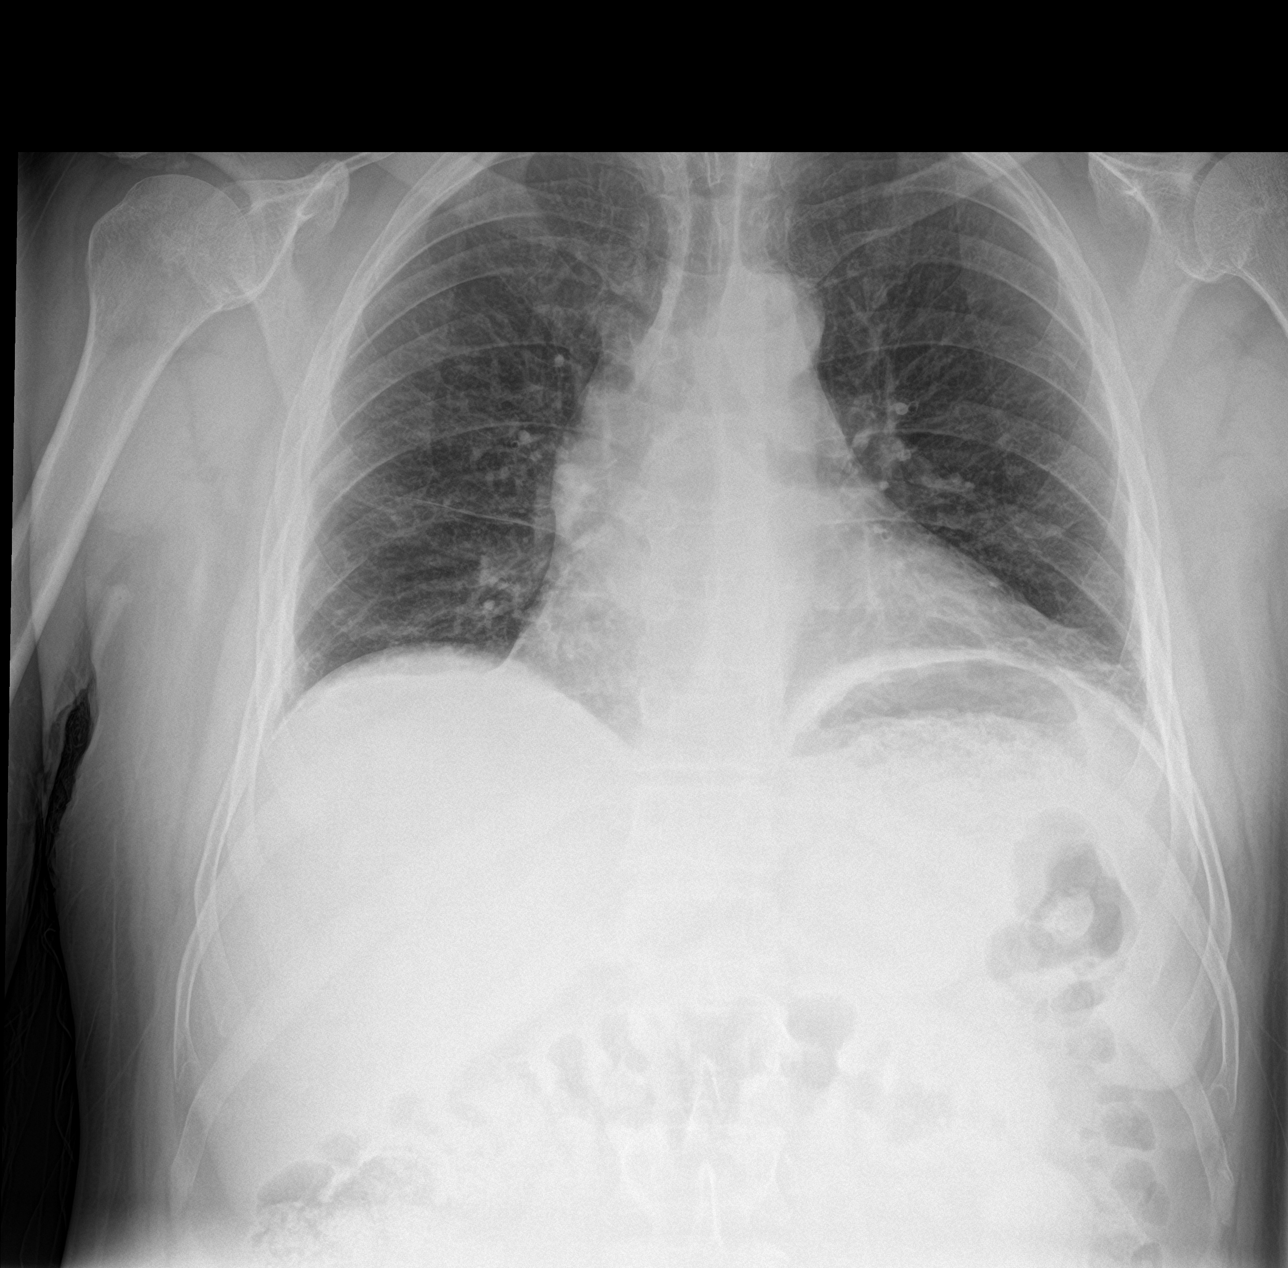

[3 of 3 positions shown; findings below may reference images not displayed]

FINDINGS: Low volume chest with mild interstitial crowding. There is no edema,
consolidation, effusion, or pneumothorax. No cardiomegaly for
technique. Visible ascending aorta in this patient with known
ascending aortic aneurysm.
IMPRESSION: 1. Low volume chest without acute finding.
2. History of ascending aortic aneurysm.

## 2015-11-29 MED ORDER — ONDANSETRON HCL 4 MG/2ML IJ SOLN
4.0000 mg | Freq: Once | INTRAMUSCULAR | Status: AC
  Administered 2015-11-29: 4 mg via INTRAVENOUS
  Filled 2015-11-29: qty 2

## 2015-11-29 MED ORDER — MORPHINE SULFATE (PF) 4 MG/ML IV SOLN
4.0000 mg | Freq: Once | INTRAVENOUS | Status: AC
  Administered 2015-11-29: 4 mg via INTRAVENOUS
  Filled 2015-11-29: qty 1

## 2015-11-29 MED ORDER — OXYCODONE-ACETAMINOPHEN 5-325 MG PO TABS
1.0000 | ORAL_TABLET | Freq: Once | ORAL | Status: AC
  Administered 2015-11-30: 1 via ORAL
  Filled 2015-11-29: qty 1

## 2015-11-29 MED ORDER — IOPAMIDOL (ISOVUE-370) INJECTION 76%
100.0000 mL | Freq: Once | INTRAVENOUS | Status: AC | PRN
  Administered 2015-11-29: 100 mL via INTRAVENOUS

## 2015-11-29 NOTE — Discharge Instructions (Signed)
Please seek medical attention for any high fevers, chest pain, shortness of breath, change in behavior, persistent vomiting, bloody stool or any other new or concerning symptoms.  

## 2015-11-29 NOTE — ED Triage Notes (Signed)
Pt reports CP to upper chest, described as tightness, w/ SOB, nausea, and dizziness.  Reports hx of aortic aneurysm, also has valve abnormalities (only 2 flaps), and recently had cath done couple weeks ago.  Pt NAD at this time.

## 2015-11-29 NOTE — ED Notes (Signed)
Patient transported to CT 

## 2015-11-29 NOTE — ED Provider Notes (Signed)
[facility] Emergency Department Provider Note   ____________________________________________   I have reviewed the triage vital signs and the nursing notes.   HISTORY  Chief Complaint Chest Pain   History limited by: Not Limited   HPI Nicholas JourneyJames W Bosler Jr. is a 55 y.o. male who presents to the emergency department today because of concerns for chest pain. He states that it started roughly about 8 hours ago. He was sleeping at that time. It is gradually gotten worse. He describes it as a crushing pressure-like pain. It is located in the center chest. There is some radiation up into the left neck. He is concerned since he does carry a diagnosis of an ascending aortic aneurysm. It was 4.5 cm at last check. At this point it is not been recommended to him to undergo surgery. They're watching it and will plan on surgical intervention if it reaches 5 cm.   Past Medical History:  Diagnosis Date  . Hyperlipidemia     Patient Active Problem List   Diagnosis Date Noted  . Coronary artery disease involving native coronary artery of native heart with angina pectoris (HCC) 09/21/2015  . Trigger finger 09/21/2015  . Hyperlipidemia 09/21/2015  . Bicuspid aortic valve 03/09/2015  . Ascending aortic aneurysm (HCC) 02/01/2015  . Erectile dysfunction 10/25/2013  . Obstructive sleep apnea 04/01/2012    Past Surgical History:  Procedure Laterality Date  . CARDIAC CATHETERIZATION Left 11/06/2015   Procedure: Left Heart Cath and Coronary Angiography;  Surgeon: Iran OuchMuhammad A Arida, MD;  Location: [facility] INVASIVE CV LAB;  Service: Cardiovascular;  Laterality: Left;    Prior to Admission medications   Medication Sig Start Date End Date Taking? Authorizing Provider  acetaminophen (TYLENOL) 325 MG tablet Take 650 mg by mouth every 6 (six) hours as needed for mild pain, moderate pain, fever or headache.    Historical Provider, MD  aspirin 325 MG tablet Take 325 mg by mouth daily.     Historical Provider, MD  atorvastatin (LIPITOR) 40 MG tablet Take 1 tablet (40 mg total) by mouth daily. Patient taking differently: Take 40 mg by mouth at bedtime.  03/06/15   Iran OuchMuhammad A Arida, MD  Multiple Vitamin (MULTIVITAMIN) tablet Take 1 tablet by mouth daily.    Historical Provider, MD    Allergies Motrin [ibuprofen] and Shellfish allergy  Family History  Problem Relation Age of Onset  . Dementia Mother   . COPD Father   . Cancer Sister 140    breast    Social History Social History  Substance Use Topics  . Smoking status: Former Games developermoker  . Smokeless tobacco: Never Used  . Alcohol use No     Comment: seldom    Review of Systems  Constitutional: Negative for fever. Cardiovascular: Positive for chest pain. Respiratory: Positive for shortness of breath. Gastrointestinal: Negative for abdominal pain, vomiting and diarrhea. Genitourinary: Negative for dysuria. Musculoskeletal: Negative for back pain. Skin: Negative for rash. Neurological: Negative for headaches, focal weakness or numbness.  10-point ROS otherwise negative.  ____________________________________________   PHYSICAL EXAM:  VITAL SIGNS: ED Triage Vitals  Enc Vitals Group     BP 11/29/15 2015 132/70     Pulse Rate 11/29/15 2015 76     Resp 11/29/15 2015 20     Temp 11/29/15 2015 98.1 F (36.7 C)     Temp Source 11/29/15 2015 Oral     SpO2 11/29/15 2015 96 %     Weight 11/29/15 2015 197 lb (89.4 kg)  Height 11/29/15 2015 5\' 10"  (1.778 m)     Head Circumference --      Peak Flow --      Pain Score 11/29/15 2016 7   Constitutional: Alert and oriented. Appears uncomfortable. Eyes: Conjunctivae are normal. Normal extraocular movements. ENT   Head: Normocephalic and atraumatic.   Nose: No congestion/rhinnorhea.   Mouth/Throat: Mucous membranes are moist.   Neck: No stridor. Hematological/Lymphatic/Immunilogical: No cervical lymphadenopathy. Cardiovascular: Normal rate, regular  rhythm.  No murmurs, rubs, or gallops. Respiratory: Normal respiratory effort without tachypnea nor retractions. Breath sounds are clear and equal bilaterally. No wheezes/rales/rhonchi. Gastrointestinal: Soft and nontender. No distention.  Genitourinary: Deferred Musculoskeletal: Normal range of motion in all extremities. No lower extremity edema. Neurologic:  Normal speech and language. No gross focal neurologic deficits are appreciated.  Skin:  Skin is warm, dry and intact. No rash noted. Psychiatric: Mood and affect are normal. Speech and behavior are normal. Patient exhibits appropriate insight and judgment.  ____________________________________________    LABS (pertinent positives/negatives)  Labs Reviewed  BASIC METABOLIC PANEL - Abnormal; Notable for the following:       Result Value   Glucose, Bld 111 (*)    All other components within normal limits  CBC - Abnormal; Notable for the following:    WBC 11.4 (*)    All other components within normal limits  TROPONIN I  TROPONIN I   2nd troponin pending at time of sign out  ____________________________________________   EKG  I, Phineas SemenGraydon Kilie Rund, attending physician, personally viewed and interpreted this EKG  EKG Time: 2016 Rate: 77 Rhythm: normal sinus rhythm Axis: left axis deviation Intervals: qtc 420 QRS: narrow, q waves aVF ST changes: no st elevation Impression: abnormal ekg   ____________________________________________    RADIOLOGY  CT angio IMPRESSION:  No acute abnormalities. Chronic aneurysm dilatation of the ascending  thoracic aorta. Borderline cardiomegaly.   CXR IMPRESSION:  1. Low volume chest without acute finding.  2. History of ascending aortic aneurysm.    I, Lamberto Dinapoli, personally viewed and evaluated these images (plain radiographs) as part of my medical decision  making. ____________________________________________   PROCEDURES  Procedures  ____________________________________________   INITIAL IMPRESSION / ASSESSMENT AND PLAN / ED COURSE  Pertinent labs & imaging results that were available during my care of the patient were reviewed by me and considered in my medical decision making (see chart for details).  Patient presented to the emergency department today because of concerns for central chest pain. Patient does have a history of ascending aortic aneurysm, 4.5 cm on last check. Will plan on getting a CT angiogram to evaluate aneurysm. Additionally will check blood work and  troponin however recent catheterization with only a few  nonoccluding lesions (20-30%).  Clinical Course    Initial troponin negative. CT angiography without concerning findings of the aorta. Will check a second troponin. If negative think patient is safe for discharge. ____________________________________________   FINAL CLINICAL IMPRESSION(S) / ED DIAGNOSES  Final diagnoses:  Nonspecific chest pain     Note: This dictation was prepared with Dragon dictation. Any transcriptional errors that result from this process are unintentional    Phineas SemenGraydon Jaielle Dlouhy, MD 11/29/15 2348

## 2015-11-29 NOTE — ED Notes (Signed)
ED Provider at bedside. 

## 2015-11-30 LAB — TROPONIN I

## 2015-12-14 ENCOUNTER — Encounter: Payer: Self-pay | Admitting: Cardiovascular Disease

## 2015-12-14 ENCOUNTER — Ambulatory Visit (INDEPENDENT_AMBULATORY_CARE_PROVIDER_SITE_OTHER): Payer: BLUE CROSS/BLUE SHIELD | Admitting: Cardiovascular Disease

## 2015-12-14 VITALS — BP 110/70 | HR 65 | Ht 70.0 in | Wt 200.2 lb

## 2015-12-14 DIAGNOSIS — I359 Nonrheumatic aortic valve disorder, unspecified: Secondary | ICD-10-CM

## 2015-12-14 DIAGNOSIS — I712 Thoracic aortic aneurysm, without rupture: Secondary | ICD-10-CM

## 2015-12-14 DIAGNOSIS — I7121 Aneurysm of the ascending aorta, without rupture: Secondary | ICD-10-CM

## 2015-12-14 DIAGNOSIS — I2510 Atherosclerotic heart disease of native coronary artery without angina pectoris: Secondary | ICD-10-CM | POA: Diagnosis not present

## 2015-12-14 DIAGNOSIS — E7800 Pure hypercholesterolemia, unspecified: Secondary | ICD-10-CM

## 2015-12-14 NOTE — Patient Instructions (Addendum)
Medication Instructions:  Your physician recommends that you continue on your current medications as directed. Please refer to the Current Medication list given to you today.   Labwork: none  Testing/Procedures: Your physician has requested that you have an echocardiogram in Oct 2018. Echocardiography is a painless test that uses sound waves to create images of your heart. It provides your doctor with information about the size and shape of your heart and how well your heart's chambers and valves are working. This procedure takes approximately one hour. There are no restrictions for this procedure.    Follow-Up: Your physician wants you to follow-up in: October 2018 with Dr. Kirke Juarez.  You will receive a reminder letter in the mail two months in advance. If you don't receive a letter, please call our office to schedule the follow-up appointment.   Any Other Special Instructions Will Be Listed Below (If Applicable).     If you need a refill on your cardiac medications before your next appointment, please call your pharmacy.  Echocardiogram An echocardiogram, or echocardiography, uses sound waves (ultrasound) to produce an image of your heart. The echocardiogram is simple, painless, obtained within a short period of time, and offers valuable information to your health care provider. The images from an echocardiogram can provide information such as:  Evidence of coronary artery disease (CAD).  Heart size.  Heart muscle function.  Heart valve function.  Aneurysm detection.  Evidence of a past heart attack.  Fluid buildup around the heart.  Heart muscle thickening.  Assess heart valve function. Tell a health care provider about:  Any allergies you have.  All medicines you are taking, including vitamins, herbs, eye drops, creams, and over-the-counter medicines.  Any problems you or family members have had with anesthetic medicines.  Any blood disorders you have.  Any  surgeries you have had.  Any medical conditions you have.  Whether you are pregnant or may be pregnant. What happens before the procedure? No special preparation is needed. Eat and drink normally. What happens during the procedure?  In order to produce an image of your heart, gel will be applied to your chest and a wand-like tool (transducer) will be moved over your chest. The gel will help transmit the sound waves from the transducer. The sound waves will harmlessly bounce off your heart to allow the heart images to be captured in real-time motion. These images will then be recorded.  You may need an IV to receive a medicine that improves the quality of the pictures. What happens after the procedure? You may return to your normal schedule including diet, activities, and medicines, unless your health care provider tells you otherwise. This information is not intended to replace advice given to you by your health care provider. Make sure you discuss any questions you have with your health care provider. Document Released: 12/22/1999 Document Revised: 08/12/2015 Document Reviewed: 08/31/2012 Elsevier Interactive Patient Education  2017 [employer]

## 2015-12-14 NOTE — Progress Notes (Signed)
Cardiology Office Note   Date:  12/14/2015   ID:  Nicholas JourneyJames W Sorce Jr., DOB 11-26-1960, MRN [id number]  PCP:  Tommie SamsJayce G Cook, DO  Cardiologist:   Lorine BearsMuhammad Arida, MD   Chief Complaint  Patient presents with  . other    49mo f/u. Pt had cath 3 weeks ago. Pt in ED 11/29/15 for chest pain. Pt states no chest pain since ED visit. Pt c/o fatigue. Reviewed meds with pt verbally.      History of Present Illness: Nicholas JourneyJames W Tripoli Jr. is a 55 y.o. male who presents for a follow-up visit regarding  ascending aortic aneurysm, bicuspid aortic valve and moderate nonobstructive one-vessel coronary artery disease. He was seen by both Dr. Tyrone SageGerhardt in Dr. Donata ClayVan Trigt. Both of them recommended surgery once the sizes reaches 50 mm. The patient continued to have atypical chest pain and thus I proceeded with a left heart catheterization in October which showed only mild proximal LAD stenosis with no obstructive disease. Normal ejection fraction. He had an emergency room visit after that with atypical chest pain with negative workup.    Past Medical History:  Diagnosis Date  . Hyperlipidemia     Past Surgical History:  Procedure Laterality Date  . CARDIAC CATHETERIZATION Left 11/06/2015   Procedure: Left Heart Cath and Coronary Angiography;  Surgeon: Iran OuchMuhammad A Arida, MD;  Location: [facility] INVASIVE CV LAB;  Service: Cardiovascular;  Laterality: Left;     Current Outpatient Prescriptions  Medication Sig Dispense Refill  . acetaminophen (TYLENOL) 325 MG tablet Take 650 mg by mouth every 6 (six) hours as needed for mild pain, moderate pain, fever or headache.    Marland Kitchen. aspirin 81 MG chewable tablet Chew 81 mg by mouth daily.    Marland Kitchen. atorvastatin (LIPITOR) 40 MG tablet Take 1 tablet (40 mg total) by mouth daily. (Patient taking differently: Take 40 mg by mouth at bedtime. ) 30 tablet 11  . Multiple Vitamin (MULTIVITAMIN) tablet Take 1 tablet by mouth daily.     No current facility-administered medications for  this visit.     Allergies:   Motrin [ibuprofen] and Shellfish allergy    Social History:  The patient  reports that he has quit smoking. He has never used smokeless tobacco. He reports that he does not drink alcohol or use drugs.   Family History:  The patient's family history includes COPD in his father; Cancer (age of onset: 3840) in his sister; Dementia in his mother.     PHYSICAL EXAM: VS:  BP 110/70 (BP Location: Left Arm, Patient Position: Sitting, Cuff Size: Normal)   Pulse 65   Ht 5\' 10"  (1.778 m)   Wt 200 lb 4 oz (90.8 kg)   BMI 28.73 kg/m  , BMI Body mass index is 28.73 kg/m. GEN: Well nourished, well developed, in no acute distress  HEENT: normal  Neck: no JVD, carotid bruits, or masses Cardiac: RRR; no murmurs, rubs, or gallops,no edema  Respiratory:  clear to auscultation bilaterally, normal work of breathing GI: soft, nontender, nondistended, + BS MS: no deformity or atrophy  Skin: warm and dry, no rash Neuro:  Strength and sensation are intact Psych: euthymic mood, full affect Right radial pulse is normal.  EKG:  EKG ordered today. EKG showed normal sinus rhythm with borderline LVH and prior inferior infarct which was present on previous EKG    Recent Labs: 04/17/2015: ALT 30 11/29/2015: BUN 13; Creatinine, Ser 1.20; Hemoglobin 14.1; Platelets 301; Potassium 3.7; Sodium 137  Lipid Panel    Component Value Date/Time   CHOL 154 04/17/2015 1528   TRIG 271 (H) 04/17/2015 1528   HDL 37 (L) 04/17/2015 1528   CHOLHDL 4.2 04/17/2015 1528   CHOLHDL 5 10/25/2013 0853   VLDL 10.8 10/25/2013 0853   LDLCALC 63 04/17/2015 1528      Wt Readings from Last 3 Encounters:  12/14/15 200 lb 4 oz (90.8 kg)  11/29/15 197 lb (89.4 kg)  11/06/15 200 lb (90.7 kg)      Other studies Reviewed: Additional studies/ records that were reviewed today include: echocardiogram, CTA of the coronary arteries.    ASSESSMENT AND PLAN:  1.  Coronary artery disease involving  native coronary arteries  Without angina: Cardiac catheterization showed no obstructive coronary artery disease other than mild plaque in the proximal LAD. Continue medical therapy.  2.  Bicuspid aortic valve: with no significant stenosis or regurgitation.  Repeat echocardiogram in October 2018.  3. Ascending aortic aneurysm in the setting of bicuspid aortic valve: given the presence of bicuspid aortic valve, the threshold for surgical repair is 50 mm.   4. Hyperlipidemia:   continue treatment with atorvastatin. Most recent LDL was 63.    Disposition:   FU with me in 10 months  Signed,  Lorine BearsMuhammad Arida, MD  12/14/2015 11:52 AM    [facility]

## 2015-12-27 ENCOUNTER — Telehealth: Payer: Self-pay | Admitting: Family Medicine

## 2015-12-27 NOTE — Telephone Encounter (Signed)
Spouse wanted to switch from dr cook to either [place] or Jae DireKate  Is it ok to schedule??

## 2015-12-27 NOTE — Telephone Encounter (Signed)
Ok

## 2015-12-27 NOTE — Telephone Encounter (Signed)
Fine with me

## 2016-01-04 NOTE — Telephone Encounter (Signed)
Bonita QuinLinda can you call this pt for me Thanks robin

## 2016-02-28 ENCOUNTER — Encounter: Payer: Self-pay | Admitting: Emergency Medicine

## 2016-02-28 ENCOUNTER — Emergency Department
Admission: EM | Admit: 2016-02-28 | Discharge: 2016-02-28 | Disposition: A | Discharge status: Home / Self Care | Payer: BLUE CROSS/BLUE SHIELD | Attending: Student in an Organized Health Care Education/Training Program | Admitting: Student in an Organized Health Care Education/Training Program

## 2016-02-28 DIAGNOSIS — Z79899 Other long term (current) drug therapy: Secondary | ICD-10-CM | POA: Insufficient documentation

## 2016-02-28 DIAGNOSIS — S5011XA Contusion of right forearm, initial encounter: Secondary | ICD-10-CM | POA: Diagnosis not present

## 2016-02-28 DIAGNOSIS — Y92410 Unspecified street and highway as the place of occurrence of the external cause: Secondary | ICD-10-CM | POA: Diagnosis not present

## 2016-02-28 DIAGNOSIS — Y999 Unspecified external cause status: Secondary | ICD-10-CM | POA: Insufficient documentation

## 2016-02-28 DIAGNOSIS — Z7982 Long term (current) use of aspirin: Secondary | ICD-10-CM | POA: Insufficient documentation

## 2016-02-28 DIAGNOSIS — S199XXA Unspecified injury of neck, initial encounter: Secondary | ICD-10-CM | POA: Diagnosis present

## 2016-02-28 DIAGNOSIS — Y9389 Activity, other specified: Secondary | ICD-10-CM | POA: Diagnosis not present

## 2016-02-28 DIAGNOSIS — S161XXA Strain of muscle, fascia and tendon at neck level, initial encounter: Secondary | ICD-10-CM

## 2016-02-28 DIAGNOSIS — Z87891 Personal history of nicotine dependence: Secondary | ICD-10-CM | POA: Insufficient documentation

## 2016-02-28 DIAGNOSIS — S5010XA Contusion of unspecified forearm, initial encounter: Secondary | ICD-10-CM

## 2016-02-28 MED ORDER — CYCLOBENZAPRINE HCL 10 MG PO TABS: 10.0000 mg | ORAL_TABLET | Freq: Three times a day (TID) | ORAL | 0 refills | Status: DC | PRN

## 2016-02-28 MED ORDER — TRAMADOL HCL 50 MG PO TABS: 50.0000 mg | ORAL_TABLET | Freq: Four times a day (QID) | ORAL | 0 refills | Status: DC | PRN

## 2016-02-28 MED ORDER — TRAMADOL HCL 50 MG PO TABS
50.0000 mg | ORAL_TABLET | Freq: Once | ORAL | Status: AC
  Administered 2016-02-28: 50 mg via ORAL
  Filled 2016-02-28: qty 1

## 2016-02-28 MED ORDER — CYCLOBENZAPRINE HCL 10 MG PO TABS
10.0000 mg | ORAL_TABLET | Freq: Once | ORAL | Status: AC
  Administered 2016-02-28: 10 mg via ORAL
  Filled 2016-02-28: qty 1
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
## 2016-03-14 ENCOUNTER — Other Ambulatory Visit: Payer: Self-pay | Admitting: *Deleted

## 2016-03-14 DIAGNOSIS — I712 Thoracic aortic aneurysm, without rupture, unspecified: Secondary | ICD-10-CM

## 2016-03-19 ENCOUNTER — Other Ambulatory Visit: Payer: Self-pay | Admitting: Cardiovascular Disease

## 2016-03-19 DIAGNOSIS — E785 Hyperlipidemia, unspecified: Secondary | ICD-10-CM

## 2016-04-10 ENCOUNTER — Other Ambulatory Visit: Payer: Self-pay

## 2016-04-10 MED ORDER — ATORVASTATIN CALCIUM 40 MG PO TABS: 40.0000 mg | ORAL_TABLET | Freq: Every day | ORAL | 3 refills | Status: DC

## 2016-04-10 NOTE — Telephone Encounter (Signed)
Requested Prescriptions   Signed Prescriptions Disp Refills  . atorvastatin (LIPITOR) 40 MG tablet 90 tablet 3    Sig: Take 1 tablet (40 mg total) by mouth daily.    Authorizing Provider: ARIDA, MUHAMMAD A    Ordering User: Kelvin Burpee N    

## 2016-04-24 ENCOUNTER — Ambulatory Visit
Admission: RE | Admit: 2016-04-24 | Discharge: 2016-04-24 | Disposition: A | Discharge status: Home / Self Care | Payer: BLUE CROSS/BLUE SHIELD | Source: Ambulatory Visit | Attending: Cardiothoracic Surgery | Admitting: Cardiothoracic Surgery

## 2016-04-24 ENCOUNTER — Ambulatory Visit (INDEPENDENT_AMBULATORY_CARE_PROVIDER_SITE_OTHER): Payer: BLUE CROSS/BLUE SHIELD | Admitting: Cardiothoracic Surgery

## 2016-04-24 ENCOUNTER — Encounter: Payer: Self-pay | Admitting: Cardiothoracic Surgery

## 2016-04-24 VITALS — BP 113/77 | HR 64 | Resp 16 | Ht 70.0 in | Wt 198.0 lb

## 2016-04-24 DIAGNOSIS — I7121 Aneurysm of the ascending aorta, without rupture: Secondary | ICD-10-CM

## 2016-04-24 DIAGNOSIS — I712 Thoracic aortic aneurysm, without rupture, unspecified: Secondary | ICD-10-CM

## 2016-04-24 MED ORDER — IOPAMIDOL (ISOVUE-370) INJECTION 76%
75.0000 mL | Freq: Once | INTRAVENOUS | Status: AC | PRN
  Administered 2016-04-24: 75 mL via INTRAVENOUS
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
## 2016-04-25 ENCOUNTER — Encounter: Payer: Self-pay | Admitting: Neurology

## 2016-05-22 ENCOUNTER — Encounter: Payer: Self-pay | Admitting: Emergency Medicine

## 2016-05-22 ENCOUNTER — Ambulatory Visit
Admission: EM | Admit: 2016-05-22 | Discharge: 2016-05-22 | Disposition: A | Discharge status: Home / Self Care | Payer: BLUE CROSS/BLUE SHIELD | Attending: Family Medicine | Admitting: Family Medicine

## 2016-05-22 ENCOUNTER — Emergency Department: Payer: BLUE CROSS/BLUE SHIELD

## 2016-05-22 ENCOUNTER — Emergency Department
Admission: EM | Admit: 2016-05-22 | Discharge: 2016-05-22 | Disposition: A | Discharge status: Home / Self Care | Payer: BLUE CROSS/BLUE SHIELD | Attending: Emergency Medicine | Admitting: Emergency Medicine

## 2016-05-22 DIAGNOSIS — R197 Diarrhea, unspecified: Secondary | ICD-10-CM | POA: Diagnosis not present

## 2016-05-22 DIAGNOSIS — R1084 Generalized abdominal pain: Secondary | ICD-10-CM | POA: Diagnosis not present

## 2016-05-22 DIAGNOSIS — Z87891 Personal history of nicotine dependence: Secondary | ICD-10-CM | POA: Diagnosis not present

## 2016-05-22 DIAGNOSIS — Z7982 Long term (current) use of aspirin: Secondary | ICD-10-CM | POA: Diagnosis not present

## 2016-05-22 DIAGNOSIS — R1110 Vomiting, unspecified: Secondary | ICD-10-CM

## 2016-05-22 DIAGNOSIS — I2510 Atherosclerotic heart disease of native coronary artery without angina pectoris: Secondary | ICD-10-CM | POA: Diagnosis not present

## 2016-05-22 DIAGNOSIS — R112 Nausea with vomiting, unspecified: Secondary | ICD-10-CM

## 2016-05-22 DIAGNOSIS — Z79899 Other long term (current) drug therapy: Secondary | ICD-10-CM | POA: Insufficient documentation

## 2016-05-22 LAB — CBC
HCT: 47.2 % (ref 40.0–52.0)
Hemoglobin: 16.1 g/dL (ref 13.0–18.0)
MCH: 29.7 pg (ref 26.0–34.0)
MCHC: 34 g/dL (ref 32.0–36.0)
MCV: 87.3 fL (ref 80.0–100.0)
PLATELETS: 317 10*3/uL (ref 150–440)
RBC: 5.4 MIL/uL (ref 4.40–5.90)
RDW: 13.7 % (ref 11.5–14.5)
WBC: 9.1 10*3/uL (ref 3.8–10.6)

## 2016-05-22 LAB — COMPREHENSIVE METABOLIC PANEL
ALBUMIN: 4.3 g/dL (ref 3.5–5.0)
ALK PHOS: 88 U/L (ref 38–126)
ALT: 21 U/L (ref 17–63)
AST: 22 U/L (ref 15–41)
Anion gap: 8 (ref 5–15)
BILIRUBIN TOTAL: 1.1 mg/dL (ref 0.3–1.2)
BUN: 11 mg/dL (ref 6–20)
CALCIUM: 9.8 mg/dL (ref 8.9–10.3)
CO2: 29 mmol/L (ref 22–32)
Chloride: 102 mmol/L (ref 101–111)
Creatinine, Ser: 0.94 mg/dL (ref 0.61–1.24)
GFR calc Af Amer: >60 mL/min (ref >60)
GLUCOSE: 97 mg/dL (ref 65–99)
Potassium: 3.5 mmol/L (ref 3.5–5.1)
Sodium: 139 mmol/L (ref 135–145)
TOTAL PROTEIN: 8 g/dL (ref 6.5–8.1)

## 2016-05-22 LAB — LIPASE, BLOOD: Lipase: 21 U/L (ref 11–51)

## 2016-05-22 MED ORDER — ONDANSETRON HCL 4 MG/2ML IJ SOLN
INTRAMUSCULAR | Status: AC
  Filled 2016-05-22: qty 2

## 2016-05-22 MED ORDER — ONDANSETRON 4 MG PO TBDP: 4.0000 mg | ORAL_TABLET | Freq: Three times a day (TID) | ORAL | 0 refills | Status: DC | PRN

## 2016-05-22 MED ORDER — ONDANSETRON HCL 4 MG/2ML IJ SOLN
4.0000 mg | Freq: Once | INTRAMUSCULAR | Status: AC
  Administered 2016-05-22: 4 mg via INTRAVENOUS

## 2016-05-22 MED ORDER — MORPHINE SULFATE (PF) 4 MG/ML IV SOLN
6.0000 mg | Freq: Once | INTRAVENOUS | Status: AC
Start: 2016-05-22 — End: 2016-05-22
  Administered 2016-05-22: 6 mg via INTRAVENOUS
  Filled 2016-05-22: qty 2

## 2016-05-22 MED ORDER — IOPAMIDOL (ISOVUE-300) INJECTION 61%
100.0000 mL | Freq: Once | INTRAVENOUS | Status: AC | PRN
  Administered 2016-05-22: 100 mL via INTRAVENOUS

## 2016-05-22 MED ORDER — PROMETHAZINE HCL 25 MG/ML IJ SOLN
25.0000 mg | Freq: Once | INTRAMUSCULAR | Status: AC
  Administered 2016-05-22: 25 mg via INTRAMUSCULAR

## 2016-05-22 MED ORDER — IOPAMIDOL (ISOVUE-300) INJECTION 61%
30.0000 mL | Freq: Once | INTRAVENOUS | Status: AC | PRN
  Administered 2016-05-22: 30 mL via ORAL

## 2016-05-22 MED ORDER — SODIUM CHLORIDE 0.9 % IV BOLUS (SEPSIS)
1000.0000 mL | Freq: Once | INTRAVENOUS | Status: AC
  Administered 2016-05-22: 1000 mL via INTRAVENOUS

## 2016-05-22 NOTE — ED Notes (Signed)
Given something to eat and drink

## 2016-05-22 NOTE — ED Notes (Signed)

## 2016-05-22 NOTE — ED Triage Notes (Signed)
Patient complains of vomiting and diarrhea that started Saturday pm into Sunday. Patient states that he thought he was improving on Monday and symptoms returned yesterday evening. Patient reports that he has dizziness with this the entire time.

## 2016-05-22 NOTE — ED Triage Notes (Signed)
[facility] sent from [facility] for evaluation of possible bowel obstruction. Pt reports vomiting and diarrhea that began four days ago. Pt reports diarrhea has resolved but vomiting continues. Pt states can not hold anything down and has not had a bowel movement since Sunday. Pt states [facility] MD told him he did not hear any bowel sounds.

## 2016-05-22 NOTE — ED Provider Notes (Signed)
[facility]    CSN: [id number] Arrival date & time: 05/22/16  0847     History   Chief Complaint Chief Complaint  Patient presents with  . Emesis    HPI Nicholas Juarez. is a 56 y.o. male.   Patient's 56 year old white male with a history of nausea vomiting diarrhea started early Sunday morning states he was at work had to leave work because of the symptoms. The diarrhea stopped on Monday the nausea and vomiting has persisted. He is not able to keep anything down and when he's been given Pepto-Bismol that has been coming back up as well. Because of continual vomiting he was finally brought in to be seen and evaluated. Point his wife Nicholas Juarez is sick in the household is eaten anything unusual either. He has a history of previous MI he has a history of aortic aneurysm and is pending future valve replacement. No known drug allergies he's on statin and aspirin which she's not taken since Saturday. No previous surgeries. He is a former smoker mother with dementia father COPD and sister with breast cancer.   The history is provided by the patient. No language interpreter was used.  Emesis  Severity:  Moderate Timing:  Constant Quality:  Undigested food Able to tolerate:  Liquids and solids Progression:  Worsening Chronicity:  New Recent urination:  Decreased Relieved by:  Nothing Worsened by:  Antiemetics Ineffective treatments:  None tried Associated symptoms: abdominal pain, diarrhea and fever     Past Medical History:  Diagnosis Date  . Hyperlipidemia     Patient Active Problem List   Diagnosis Date Noted  . Coronary artery disease involving native coronary artery of native heart with angina pectoris (HCC) 09/21/2015  . Trigger finger 09/21/2015  . Hyperlipidemia 09/21/2015  . Bicuspid aortic valve 03/09/2015  . Ascending aortic aneurysm (HCC) 02/01/2015  . Erectile dysfunction 10/25/2013  . Obstructive sleep apnea 04/01/2012    Past Surgical  History:  Procedure Laterality Date  . CARDIAC CATHETERIZATION Left 11/06/2015   Procedure: Left Heart Cath and Coronary Angiography;  Surgeon: Iran Ouch, MD;  Location: [facility] INVASIVE CV LAB;  Service: Cardiovascular;  Laterality: Left;       Home Medications    Prior to Admission medications   Medication Sig Start Date End Date Taking? Authorizing Provider  acetaminophen (TYLENOL) 325 MG tablet Take 650 mg by mouth every 6 (six) hours as needed for mild pain, moderate pain, fever or headache.   Yes [provider]  aspirin 81 MG chewable tablet Chew 81 mg by mouth daily.   Yes [provider]  atorvastatin (LIPITOR) 40 MG tablet Take 1 tablet (40 mg total) by mouth daily. 04/10/16  Yes Iran Ouch, MD  Multiple Vitamin (MULTIVITAMIN) tablet Take 1 tablet by mouth daily.   Yes [provider]    Family History Family History  Problem Relation Age of Onset  . Dementia Mother   . COPD Father   . Cancer Sister 2       breast    Social History Social History  Substance Use Topics  . Smoking status: Former Games developer  . Smokeless tobacco: Never Used  . Alcohol use No     Comment: seldom     Allergies   Motrin [ibuprofen] and Shellfish allergy   Review of Systems Review of Systems  Constitutional: Positive for fever.  Respiratory: Negative for shortness of breath.   Gastrointestinal: Positive for abdominal pain, diarrhea and vomiting.  Neurological: Positive for dizziness.  All other systems reviewed and are negative.    Physical Exam Triage Vital Signs ED Triage Vitals  Enc Vitals Group     BP 05/22/16 0908 101/78     Pulse Rate 05/22/16 0908 77     Resp 05/22/16 0908 18     Temp 05/22/16 0908 98.2 F (36.8 C)     Temp Source 05/22/16 0908 Oral     SpO2 05/22/16 0908 99 %     Weight 05/22/16 0907 195 lb (88.5 kg)     Height 05/22/16 0907 5\' 10"  (1.778 m)     Head Circumference --      Peak Flow --      Pain Score  05/22/16 0907 7     Pain Loc --      Pain Edu? --      Excl. in GC? --    No data found.   Updated Vital Signs BP 101/78 (BP Location: Left Arm)   Pulse 77   Temp 98.2 F (36.8 C) (Oral)   Resp 18   Ht 5\' 10"  (1.778 m)   Wt 195 lb (88.5 kg)   SpO2 99%   BMI 27.98 kg/m   Visual Acuity Right Eye Distance:   Left Eye Distance:   Bilateral Distance:    Right Eye Near:   Left Eye Near:    Bilateral Near:     Physical Exam  Constitutional: He is oriented to person, place, and time. He appears well-developed and well-nourished.  HENT:  Head: Normocephalic and atraumatic.  Eyes: Pupils are equal, round, and reactive to light.  Neck: Normal range of motion. Neck supple.  Cardiovascular: Normal rate, regular rhythm, S1 normal and S2 normal.  Exam reveals distant heart sounds.   Pulmonary/Chest: Effort normal.  Abdominal: He exhibits distension. Bowel sounds are decreased. There is no hepatosplenomegaly. There is tenderness. There is CVA tenderness. No hernia.  Musculoskeletal: Normal range of motion.  Neurological: He is alert and oriented to person, place, and time.  Skin: Skin is warm.  Psychiatric: He has a normal mood and affect.  Vitals reviewed.    UC Treatments / Results  Labs (all labs ordered are listed, but only abnormal results are displayed) Labs Reviewed - No data to display  EKG  EKG Interpretation None       Radiology No results found.  Procedures Procedures (including critical care time)  Medications Ordered in UC Medications  promethazine (PHENERGAN) injection 25 mg (25 mg Intramuscular Given 05/22/16 1006)     Initial Impression / Assessment and Plan / UC Course  I have reviewed the triage vital signs and the nursing notes.  Pertinent labs & imaging results that were available during my care of the patient were reviewed by me and considered in my medical decision making (see chart for details).     Due to the complexity of his  illness discussed with him and his wife bowel obstruction is something I'm concerned about. We can get started on some things but may have sent to the hospital especially since his wife's concerned about the dizziness and lightheadedness that he's had. Her doctor to get some benefit for nausea going to the ED and I discussed the case with [facility] charge nurse at the [facility] ED. We'll give Phenergan 25 mg IM.  Final Clinical Impressions(s) / UC Diagnoses   Final diagnoses:  Nausea vomiting and diarrhea  Vomiting, intractability of vomiting not specified, presence of nausea not specified,  unspecified vomiting type    New Prescriptions Discharge Medication List as of 05/22/2016 10:00 AM      Note: This dictation was prepared with Dragon dictation along with smaller phrase technology. Any transcriptional errors that result from this process are unintentional.   Hassan RowanWade, Fabien Travelstead, MD 05/22/16 1017

## 2016-05-22 NOTE — ED Provider Notes (Signed)
[facility] Emergency Department Provider Note  ____________________________________________   First MD Initiated Contact with Patient 05/22/16 1222     (approximate)  I have reviewed the triage vital signs and the nursing notes.   HISTORY  Chief Complaint Emesis    HPI Nicholas Juarez. is a 56 y.o. male who sent to the emergency department from [facility] urgent care to evaluate for possible small bowel obstruction. He says that for 5 days ago he had significant nausea vomiting and diarrhea. She has not had a bowel movement for the past 3 or 4 days although he has been passing flatus. He has no abdominal surgical history. He does report moderate to severe cramping lower abdominal pain worse with vomiting improved when not vomiting.He is particularly concerned about a small bowel obstruction as his son had one in his 30s and had to have surgery. The physician at urgent care was concerned that he was not able to appreciate bowel sounds.   Past Medical History:  Diagnosis Date  . Hyperlipidemia     Patient Active Problem List   Diagnosis Date Noted  . Coronary artery disease involving native coronary artery of native heart with angina pectoris (HCC) 09/21/2015  . Trigger finger 09/21/2015  . Hyperlipidemia 09/21/2015  . Bicuspid aortic valve 03/09/2015  . Ascending aortic aneurysm (HCC) 02/01/2015  . Erectile dysfunction 10/25/2013  . Obstructive sleep apnea 04/01/2012    Past Surgical History:  Procedure Laterality Date  . CARDIAC CATHETERIZATION Left 11/06/2015   Procedure: Left Heart Cath and Coronary Angiography;  Surgeon: Iran Ouch, MD;  Location: [facility] INVASIVE CV LAB;  Service: Cardiovascular;  Laterality: Left;    Prior to Admission medications   Medication Sig Start Date End Date Taking? Authorizing Provider  acetaminophen (TYLENOL) 325 MG tablet Take 650 mg by mouth every 6 (six) hours as needed for mild pain, moderate pain, fever  or headache.   Yes [provider]  aspirin 81 MG chewable tablet Chew 81 mg by mouth daily.   Yes [provider]  atorvastatin (LIPITOR) 40 MG tablet Take 1 tablet (40 mg total) by mouth daily. 04/10/16  Yes Iran Ouch, MD  Multiple Vitamin (MULTIVITAMIN) tablet Take 1 tablet by mouth daily.   Yes [provider]  ondansetron (ZOFRAN ODT) 4 MG disintegrating tablet Take 1 tablet (4 mg total) by mouth every 8 (eight) hours as needed for nausea or vomiting. 05/22/16   Merrily Brittle, MD    Allergies Motrin [ibuprofen] and Shellfish allergy  Family History  Problem Relation Age of Onset  . Dementia Mother   . COPD Father   . Cancer Sister 52       breast    Social History Social History  Substance Use Topics  . Smoking status: Former Games developer  . Smokeless tobacco: Never Used  . Alcohol use No     Comment: seldom    Review of Systems Constitutional: No fever/chills Eyes: No visual changes. ENT: No sore throat. Cardiovascular: Denies chest pain. Respiratory: Denies shortness of breath. Gastrointestinal: Positive abdominal pain.  Positive nausea, positive vomiting.  No diarrhea.  No constipation. Genitourinary: Negative for dysuria. Musculoskeletal: Negative for back pain. Skin: Negative for rash. Neurological: Negative for headaches, focal weakness or numbness.   ____________________________________________   PHYSICAL EXAM:  VITAL SIGNS: ED Triage Vitals  Enc Vitals Group     BP 05/22/16 1050 110/81     Pulse Rate 05/22/16 1050 75     Resp  05/22/16 1050 18     Temp 05/22/16 1050 98.3 F (36.8 C)     Temp Source 05/22/16 1050 Oral     SpO2 05/22/16 1050 99 %     Weight 05/22/16 1050 195 lb (88.5 kg)     Height 05/22/16 1050 5\' 10"  (1.778 m)     Head Circumference --      Peak Flow --      Pain Score 05/22/16 1055 6     Pain Loc --      Pain Edu? --      Excl. in GC? --     Constitutional: Alert and oriented x 4 well appearing  nontoxic no diaphoresis speaks in full, clear sentences Eyes: PERRL EOMI. Head: Atraumatic. Nose: No congestion/rhinnorhea. Mouth/Throat: No trismus Neck: No stridor.   Cardiovascular: Normal rate, regular rhythm. Grossly normal heart sounds.  Good peripheral circulation. Respiratory: Normal respiratory effort.  No retractions. Lungs CTAB and moving good air Gastrointestinal: Soft nondistended mild diffuse tenderness without focality no rebound no guarding no peritonitis Musculoskeletal: No lower extremity edema   Neurologic:  Normal speech and language. No gross focal neurologic deficits are appreciated. Skin:  Skin is warm, dry and intact. No rash noted. Psychiatric: Mood and affect are normal. Speech and behavior are normal.    ____________________________________________   DIFFERENTIAL  Small bowel obstruction, gastroenteritis, appendicitis, diverticulitis, volvulus ____________________________________________   LABS (all labs ordered are listed, but only abnormal results are displayed)  Labs Reviewed  LIPASE, BLOOD  COMPREHENSIVE METABOLIC PANEL  CBC    Labs all within normal limits __________________________________________  EKG   ____________________________________________  RADIOLOGY  CT scan with no acute abnormalities: No small bowel obstruction ____________________________________________   PROCEDURES  Procedure(s) performed: no  Procedures  Critical Care performed: o  Observation: no ____________________________________________   INITIAL IMPRESSION / ASSESSMENT AND PLAN / ED COURSE  Pertinent labs & imaging results that were available during my care of the patient were reviewed by me and considered in my medical decision making (see chart for details).  To me the patient's clinical history is not consistent with small bowel obstruction but rather more likely with gastroenteritis. That being said the physician at urgent care was concerned  which I think is reasonable. I first obtained and acute chest series to evaluate for obvious obstruction and fortunately it is negative. At that point given the patient and his wife's continued concern I thought it was reasonable to progress on to CT scan which fortunately as well as negative for acute pathology. The patient was then able to drink soda and tolerate crackers. I had a lengthy discussion with the patient and his wife at bedside regarding the likely diagnosis of viral gastroenteritis and that I would prescribe him a short course of Zofran to give strict return precautions given. He is discharged home in improved condition.       ___________________________________________   FINAL CLINICAL IMPRESSION(S) / ED DIAGNOSES  Final diagnoses:  Nausea vomiting and diarrhea      NEW MEDICATIONS STARTED DURING THIS VISIT:  Discharge Medication List as of 05/22/2016  3:18 PM    START taking these medications   Details  ondansetron (ZOFRAN ODT) 4 MG disintegrating tablet Take 1 tablet (4 mg total) by mouth every 8 (eight) hours as needed for nausea or vomiting., Starting Wed 05/22/2016, Print         Note:  This document was prepared using Dragon voice recognition software and may include unintentional dictation errors.  Merrily Brittle, MD 05/23/16 1259

## 2016-05-22 NOTE — Discharge Instructions (Signed)
Will give 25 mg Phenergan on his way to [facility]

## 2016-05-22 NOTE — Discharge Instructions (Signed)
Please follow-up with your primary care physician in 2 days for recheck. Return to the emergency department sooner for any new or worsening symptoms such as if you cannot eat or drink worsening pain or for any other concerns.  It was a pleasure to take care of you today, and thank you for coming to our emergency department.  If you have any questions or concerns before leaving please ask the nurse to grab me and I'm more than happy to go through your aftercare instructions again.  If you were prescribed any opioid pain medication today such as Norco, Vicodin, Percocet, morphine, hydrocodone, or oxycodone please make sure you do not drive when you are taking this medication as it can alter your ability to drive safely.  If you have any concerns once you are home that you are not improving or are in fact getting worse before you can make it to your follow-up appointment, please do not hesitate to call 911 and come back for further evaluation.  Merrily Brittle MD  Results for orders placed or performed during the hospital encounter of 05/22/16  Lipase, blood  Result Value Ref Range   Lipase 21 11 - 51 U/L  Comprehensive metabolic panel  Result Value Ref Range   Sodium 139 135 - 145 mmol/L   Potassium 3.5 3.5 - 5.1 mmol/L   Chloride 102 101 - 111 mmol/L   CO2 29 22 - 32 mmol/L   Glucose, Bld 97 65 - 99 mg/dL   BUN 11 6 - 20 mg/dL   Creatinine, Ser 1.61 0.61 - 1.24 mg/dL   Calcium 9.8 8.9 - 09.6 mg/dL   Total Protein 8.0 6.5 - 8.1 g/dL   Albumin 4.3 3.5 - 5.0 g/dL   AST 22 15 - 41 U/L   ALT 21 17 - 63 U/L   Alkaline Phosphatase 88 38 - 126 U/L   Total Bilirubin 1.1 0.3 - 1.2 mg/dL   GFR calc non Af Amer >60 >60 mL/min   GFR calc Af Amer >60 >60 mL/min   Anion gap 8 5 - 15  CBC  Result Value Ref Range   WBC 9.1 3.8 - 10.6 K/uL   RBC 5.40 4.40 - 5.90 MIL/uL   Hemoglobin 16.1 13.0 - 18.0 g/dL   HCT 04.5 40.9 - 81.1 %   MCV 87.3 80.0 - 100.0 fL   MCH 29.7 26.0 - 34.0 pg   MCHC 34.0  32.0 - 36.0 g/dL   RDW 91.4 78.2 - 95.6 %   Platelets 317 150 - 440 K/uL   Ct Abdomen Pelvis W Contrast  Result Date: 05/22/2016 CLINICAL DATA:  Vomiting, diarrhea. EXAM: CT ABDOMEN AND PELVIS WITH CONTRAST TECHNIQUE: Multidetector CT imaging of the abdomen and pelvis was performed using the standard protocol following bolus administration of intravenous contrast. CONTRAST:  ISOVUE-300 IOPAMIDOL (ISOVUE-300) INJECTION 61% COMPARISON:  CT scan of January 23, 2015. FINDINGS: Lower chest: No acute abnormality. Hepatobiliary: No gallstones are noted. Two low densities are noted in superior portion of left hepatic lobe that demonstrate peripheral nodular enhancement with greater fill-in seen on delayed images consistent with hemangiomas. No other abnormality is noted. Pancreas: Unremarkable. No pancreatic ductal dilatation or surrounding inflammatory changes. Spleen: Normal in size without focal abnormality. Adrenals/Urinary Tract: Adrenal glands are unremarkable. Kidneys are normal, without renal calculi, focal lesion, or hydronephrosis. Bladder is unremarkable. Stomach/Bowel: Stomach is within normal limits. Appendix appears normal. No evidence of bowel wall thickening, distention, or inflammatory changes. Vascular/Lymphatic: No significant vascular  findings are present. No enlarged abdominal or pelvic lymph nodes. Reproductive: Mild prostatic enlargement is noted. Other: No abdominal wall hernia or abnormality. No abdominopelvic ascites. Musculoskeletal: No acute or significant osseous findings. IMPRESSION: Probable 2 left hepatic hemangiomas. Mild prostatic enlargement. No other abnormality seen in the abdomen or pelvis. Electronically Signed   By: Lupita RaiderJames  Green Jr, M.D.   On: 05/22/2016 13:03   Dg Abd Acute W/chest  Result Date: 05/22/2016 CLINICAL DATA:  Nausea and vomiting. EXAM: DG ABDOMEN ACUTE W/ 1V CHEST COMPARISON:  Chest x-ray dated 11/29/2015 FINDINGS: There is no evidence of dilated bowel  loops or free intraperitoneal air. No radiopaque calculi or other significant radiographic abnormality is seen. Heart size and mediastinal contours are within normal limits. Both lungs are clear. IMPRESSION: Negative abdominal radiographs.  No acute cardiopulmonary disease. Electronically Signed   By: Francene BoyersJames  Maxwell M.D.   On: 05/22/2016 11:55   Ct Angio Chest Aorta W &/or Wo Contrast  Result Date: 04/24/2016 CLINICAL DATA:  Follow-up TAA, former smoker quit 30+ years ago, chest pain and SOB for over a year, no hx of cancer, no hx of surgery. EXAM: CT ANGIOGRAPHY CHEST WITH CONTRAST TECHNIQUE: Multidetector CT imaging of the chest was performed using the standard protocol during bolus administration of intravenous contrast. Multiplanar CT image reconstructions and MIPs were obtained to evaluate the vascular anatomy. CONTRAST:  75 mL Isovue 370 COMPARISON:  11/29/2015 FINDINGS: Cardiovascular: Preferential opacification of the thoracic aorta. No evidence of thoracic aortic dissection. Ascending thoracic aortic aneurysm measuring 4.4 cm in diameter. No para-aortic hematoma. No intramural hematoma. Normal heart size. No pericardial effusion. Mediastinum/Nodes: No enlarged mediastinal, hilar, or axillary lymph nodes. Thyroid gland, trachea, and esophagus demonstrate no significant findings. Lungs/Pleura: Lungs are clear. No pleural effusion or pneumothorax. Upper Abdomen: No acute abnormality. Musculoskeletal: No acute osseous abnormality. No lytic or sclerotic osseous lesion. Review of the MIP images confirms the above findings. IMPRESSION: 1. Stable ascending thoracic aortic aneurysm measuring 4.4 cm. Recommend annual imaging followup by CTA or MRA. This recommendation follows 2010 ACCF/AHA/AATS/ACR/ASA/SCA/SCAI/SIR/STS/SVM Guidelines for the Diagnosis and Management of Patients with Thoracic Aortic Disease. Circulation. 2010; 121: [id number] Electronically Signed   By: Elige KoHetal  Patel   On: 04/24/2016 11:08

## 2016-06-26 ENCOUNTER — Ambulatory Visit: Payer: BLUE CROSS/BLUE SHIELD | Admitting: Neurology

## 2016-10-15 ENCOUNTER — Telehealth: Payer: Self-pay | Admitting: Cardiovascular Disease

## 2016-10-15 DIAGNOSIS — Q231 Congenital insufficiency of aortic valve: Secondary | ICD-10-CM

## 2016-10-15 NOTE — Telephone Encounter (Signed)
Patient has an order in wq for Echo with Perflutren  These have to be done at [facility]

## 2016-10-16 NOTE — Telephone Encounter (Signed)
I did not order with Perflutern??!! I think the order always default as such to give the tech the freedom to give it if needed. Regular echo is fine.

## 2016-10-16 NOTE — Telephone Encounter (Signed)
Dr. Kirke Corin,  The echo order has to administer Perflutren. Please clarify if this is needed or not for the echo. If not, then we can do it in the office.

## 2016-10-22 ENCOUNTER — Other Ambulatory Visit: Payer: Self-pay

## 2016-10-22 ENCOUNTER — Ambulatory Visit (INDEPENDENT_AMBULATORY_CARE_PROVIDER_SITE_OTHER): Payer: BLUE CROSS/BLUE SHIELD

## 2016-10-22 DIAGNOSIS — I359 Nonrheumatic aortic valve disorder, unspecified: Secondary | ICD-10-CM | POA: Diagnosis not present

## 2016-10-22 LAB — ECHOCARDIOGRAM COMPLETE
AV Area VTI index: 2.43 cm2/m2
AV Area mean vel: 4.27 cm2
AV VEL mean LVOT/AV: 0.87
AV area mean vel ind: 2.07 cm2/m2
AVA: 5 cm2
AVG: 2 mmHg
Ao-asc: 44 cm
Area-P 1/2: 3.14 cm2
CHL CUP AV VEL: 5
CHL CUP DOP CALC LVOT VTI: 21.2 cm
CHL CUP MV DEC (S): 239
DOP CAL AO MEAN VELOCITY: 70.9 cm/s
E decel time: 239 msec
EERAT: 6.19
FS: 30 % (ref 28–44)
IV/PV OW: 1.4
LA ID, A-P, ES: 35 mm
LA diam end sys: 35 mm
LA vol index: 30.1 mL/m2
LADIAMINDEX: 1.7 cm/m2
LAVOL: 62.1 mL
LAVOLA4C: 56.7 mL
LV E/e' medial: 6.19
LV E/e'average: 6.19
LVELAT: 12.7 cm/s
LVOT area: 4.91 cm2
LVOT diameter: 25 mm
LVOT peak VTI: 1.02 cm
LVOTSV: 104 mL
MV pk A vel: 67.9 m/s
MV pk E vel: 78.6 m/s
MVPG: 2 mmHg
P 1/2 time: 70 ms
PW: 10 mm — AB (ref 0.6–1.1)
TDI e' lateral: 12.7
TDI e' medial: 8.81
VTI: 20.8 cm
Valve area index: 2.43

## 2016-10-25 ENCOUNTER — Telehealth: Payer: Self-pay | Admitting: Cardiovascular Disease

## 2016-10-25 NOTE — Addendum Note (Signed)
Addended by: Stann MainlandLARK, Milla Wahlberg O on: 10/25/2016 09:53 AM   Modules accepted: Orders

## 2016-10-25 NOTE — Telephone Encounter (Signed)
Patient did not need another echo. Echo was cancelled and left message with patient apologizing for the confusion and that appointment was cancelled. Encouraged patient to call back if he had any further questions.

## 2016-10-25 NOTE — Telephone Encounter (Signed)
Pt calling asking if he needs to do another Echocardiogram  He states he just did one  Please advise.

## 2016-10-25 NOTE — Telephone Encounter (Signed)
New order for echo entered.

## 2016-11-01 ENCOUNTER — Other Ambulatory Visit: Payer: BLUE CROSS/BLUE SHIELD

## 2017-03-31 ENCOUNTER — Other Ambulatory Visit: Payer: Self-pay | Admitting: Cardiothoracic Surgery

## 2017-03-31 DIAGNOSIS — I7121 Aneurysm of the ascending aorta, without rupture: Secondary | ICD-10-CM

## 2017-03-31 DIAGNOSIS — I712 Thoracic aortic aneurysm, without rupture, unspecified: Secondary | ICD-10-CM

## 2017-04-23 ENCOUNTER — Ambulatory Visit
Admission: RE | Admit: 2017-04-23 | Discharge: 2017-04-23 | Disposition: A | Discharge status: Home / Self Care | Payer: BLUE CROSS/BLUE SHIELD | Source: Ambulatory Visit | Attending: Cardiothoracic Surgery | Admitting: Cardiothoracic Surgery

## 2017-04-23 ENCOUNTER — Ambulatory Visit (INDEPENDENT_AMBULATORY_CARE_PROVIDER_SITE_OTHER): Payer: BLUE CROSS/BLUE SHIELD | Admitting: Cardiothoracic Surgery

## 2017-04-23 ENCOUNTER — Encounter: Payer: Self-pay | Admitting: Cardiothoracic Surgery

## 2017-04-23 VITALS — BP 124/76 | HR 64 | Resp 20 | Ht 70.0 in | Wt 198.0 lb

## 2017-04-23 DIAGNOSIS — I712 Thoracic aortic aneurysm, without rupture, unspecified: Secondary | ICD-10-CM

## 2017-04-23 DIAGNOSIS — I7121 Aneurysm of the ascending aorta, without rupture: Secondary | ICD-10-CM

## 2017-04-23 MED ORDER — IOPAMIDOL (ISOVUE-370) INJECTION 76%
75.0000 mL | Freq: Once | INTRAVENOUS | Status: AC | PRN
  Administered 2017-04-23: 75 mL via INTRAVENOUS

## 2017-04-23 NOTE — Progress Notes (Signed)
PCP is Patient, No Pcp Per Referring Provider is Iran Ouch, MD  Chief Complaint  Patient presents with  . Thoracic Aortic Aneurysm    1 year f/u with CTA Chest    HPI: One year follow-up with CTA of thoracic aorta for follow-up of a known asymptomatic ascending fusiform aneurysm, 4.4 cm since diagnosis 2017.  The patient has a known bicuspid aortic valve which functions well and normal LV function.  He has normal coronaries by cardiac cath.  The patient does not smoke.  He is on no blood pressure medication but checks his blood pressure at home.  Highest readings are in excess of 150 mmHg.  Currently has no primary care physician.  He takes Lipitor and aspirin.  He works full-time in Engineer, site.  CT scan today shows slight increase in the diameter now 4.6 cm.  The aortic wall is smooth without hematoma or ulceration.  Because of the increase in diameter of the aorta to 4.6 cm and history of blood pressure increases we will place him on Toprol-XL 25 mg daily. The patient will return for follow-up CT scan in 6 months.  Threshold for aortic replacement in a patient with bicuspid valve is 5 cm.  Past Medical History:  Diagnosis Date  . Hyperlipidemia     Past Surgical History:  Procedure Laterality Date  . CARDIAC CATHETERIZATION Left 11/06/2015   Procedure: Left Heart Cath and Coronary Angiography;  Surgeon: Iran Ouch, MD;  Location: [facility] INVASIVE CV LAB;  Service: Cardiovascular;  Laterality: Left;    Family History  Problem Relation Age of Onset  . Dementia Mother   . COPD Father   . Cancer Sister 64       breast    Social History Social History   Tobacco Use  . Smoking status: Former Games developer  . Smokeless tobacco: Never Used  Substance Use Topics  . Alcohol use: No    Comment: seldom  . Drug use: No    Current Outpatient Medications  Medication Sig Dispense Refill  . acetaminophen (TYLENOL) 325 MG tablet Take 650 mg by mouth every 6 (six) hours as  needed for mild pain, moderate pain, fever or headache.    Marland Kitchen aspirin 81 MG chewable tablet Chew 81 mg by mouth daily.    Marland Kitchen atorvastatin (LIPITOR) 40 MG tablet Take 1 tablet (40 mg total) by mouth daily. 90 tablet 3  . Multiple Vitamin (MULTIVITAMIN) tablet Take 1 tablet by mouth daily.     No current facility-administered medications for this visit.     Allergies  Allergen Reactions  . Motrin [Ibuprofen] Other (See Comments)    Only high doses; skin tingling.  . Shellfish Allergy Other (See Comments)    Skin tingling.         Review of Systems :  [ y ] = yes, [  ] = no        General :  Weight gain [5 pounds]    Weight loss  [   ]  Fatigue [  ]  Fever [  ]  Chills  [  ]                                Weakness  [  ]           HEENT    Headache [  ]  Dizziness [yes when blood pressure elevated]  Blurred vision [  ]  Glaucoma  [  ]                          Nosebleeds [  ] Painful or loose teeth [  ]        Cardiac :  Chest pain/ pressure [  ]  Resting SOB [  ] exertional SOB [  ]                        Orthopnea [  ]  Pedal edema  [  ]  Palpitations [  ] Syncope/presyncope [ ]                         Paroxysmal nocturnal dyspnea [  ]         Pulmonary : cough [  ]  wheezing [  ]  Hemoptysis [  ] Sputum [  ] Snoring [  ]                              Pneumothorax [  ]  Sleep apnea [  ] soreness on left side of thorax        GI : Vomiting [  ]  Dysphagia [  ]  Melena  [  ]  Abdominal pain [  ] BRBPR [  ]              Heart burn [  ]  Constipation [  ] Diarrhea  [  ] Colonoscopy [   ]        GU : Hematuria [  ]  Dysuria [  ]  Nocturia [  ] UTI's [  ]        Vascular : Claudication [  ]  Rest pain [  ]  DVT [  ] Vein stripping [  ] leg ulcers [  ]                          TIA [  ] Stroke [  ]  Varicose veins [  ]        NEURO :  Headaches  [  ] Seizures [  ] Vision changes [  ] Paresthesias [  ]                                       Seizures [  ]        Musculoskeletal :  Arthritis [   ] Gout  [  ]  Back pain [  ]  Joint pain [  ]        Skin :  Rash [  ]  Melanoma [  ] Sores [  ]        Heme : Bleeding problems [  ]Clotting Disorders [  ] Anemia [  ]Blood Transfusion [ ]         Endocrine : Diabetes [  ] Heat or Cold intolerance [  ] Polyuria [  ]excessive thirst [ ]         Psych : Depression [  ]  Anxiety [  ]  Psych hospitalizations [  ] Memory change [  ]  BP 124/76   Pulse 64   Resp 20   Ht 5\' 10"  (1.778 m)   Wt 198 lb (89.8 kg)   BMI 28.41 kg/m  Physical Exam      Exam    General- alert and comfortable    Neck- no JVD, no cervical adenopathy palpable, no carotid bruit   Lungs- clear without rales, wheezes   Cor- regular rate and rhythm, no murmur , gallop   Abdomen- soft, non-tender   Extremities - warm, non-tender, minimal edema   Neuro- oriented, appropriate, no focal weakness  Diagnostic Tests: CTA images personally reviewed and counseled patient  Impression: Ascending fusiform aneurysm now measuring 4.6 cm, up slightly from 1 year ago when the diameter measured 4.4 cm. Patient is having evidence of high blood pressure and will be started on Toprol-XL 25 mg.  He is recommended to check his blood pressure weekly and to keep the top number less than 140 mmHg.  Plan: Return in 6 months with CTA of thoracic aorta.  Inform us of severe chest or upper back pain.  Weight limit less than 50 pounds.   Mikey BussingPeter Van Trigt III, MD [facility] [phone]

## 2017-05-23 ENCOUNTER — Other Ambulatory Visit: Payer: Self-pay | Admitting: Cardiovascular Disease

## 2017-06-03 ENCOUNTER — Other Ambulatory Visit: Payer: Self-pay | Admitting: Cardiovascular Disease

## 2017-06-13 ENCOUNTER — Encounter: Payer: Self-pay | Admitting: Cardiovascular Disease

## 2017-06-13 ENCOUNTER — Ambulatory Visit (INDEPENDENT_AMBULATORY_CARE_PROVIDER_SITE_OTHER): Payer: BLUE CROSS/BLUE SHIELD | Admitting: Cardiovascular Disease

## 2017-06-13 VITALS — BP 118/80 | Ht 70.0 in | Wt 195.5 lb

## 2017-06-13 DIAGNOSIS — I712 Thoracic aortic aneurysm, without rupture: Secondary | ICD-10-CM | POA: Diagnosis not present

## 2017-06-13 DIAGNOSIS — Q231 Congenital insufficiency of aortic valve: Secondary | ICD-10-CM

## 2017-06-13 DIAGNOSIS — I2510 Atherosclerotic heart disease of native coronary artery without angina pectoris: Secondary | ICD-10-CM

## 2017-06-13 DIAGNOSIS — E785 Hyperlipidemia, unspecified: Secondary | ICD-10-CM | POA: Diagnosis not present

## 2017-06-13 DIAGNOSIS — I7121 Aneurysm of the ascending aorta, without rupture: Secondary | ICD-10-CM

## 2017-06-13 MED ORDER — ATORVASTATIN CALCIUM 40 MG PO TABS: 40.0000 mg | ORAL_TABLET | Freq: Every day | ORAL | 3 refills | Status: DC

## 2017-06-13 NOTE — Progress Notes (Signed)
Cardiology Office Note   Date:  06/13/2017   ID:  Nicholas Juarez., DOB June 14, 1960, MRN [id number]  PCP:  Patient, No Pcp Per  Cardiologist:   Lorine Bears, MD   Chief Complaint  Patient presents with  . OTHER    Overdue appointment c/o chest/rib cage tightness, Pt concerned RT arm BP higher than LT arm BP and sob. Meds reviewed verbally with pt.      History of Present Illness: Nicholas Juarez. is a 57 y.o. male who presents for a follow-up visit regarding  ascending aortic aneurysm, bicuspid aortic valve and mild nonobstructive one-vessel coronary artery disease. He was seen by both Dr. Tyrone Sage in Dr. Donata Clay. Both of them recommended surgery once the sizes reaches 50 mm.   Left heart catheterization in October 2017 showed only mild proximal LAD stenosis ( 30%) with no obstructive disease. Normal ejection fraction.  Most recent echocardiogram in October 2018 showed normal LV systolic function, bicuspid aortic valve with no significant stenosis and moderately dilated a sending aorta at 4.4 cm.  CTA in April of this year showed 4.6 ascending thoracic aortic aneurysm.  He was seen by Dr. Maren Beach in April and was placed on Toprol 25 mg once daily given slight enlargement of the aneurysm.  He has been doing well with no chest pain, shortness of breath or palpitations.  Past Medical History:  Diagnosis Date  . Hyperlipidemia     Past Surgical History:  Procedure Laterality Date  . CARDIAC CATHETERIZATION Left 11/06/2015   Procedure: Left Heart Cath and Coronary Angiography;  Surgeon: Iran Ouch, MD;  Location: [facility] INVASIVE CV LAB;  Service: Cardiovascular;  Laterality: Left;     Current Outpatient Medications  Medication Sig Dispense Refill  . acetaminophen (TYLENOL) 325 MG tablet Take 650 mg by mouth every 6 (six) hours as needed for mild pain, moderate pain, fever or headache.    Marland Kitchen aspirin 81 MG chewable tablet Chew 81 mg by mouth daily.    Marland Kitchen  atorvastatin (LIPITOR) 40 MG tablet Take 1 tablet (40 mg total) by mouth daily. 90 tablet 3  . metoprolol succinate (TOPROL-XL) 25 MG 24 hr tablet Take 25 mg by mouth daily.  6  . Multiple Vitamin (MULTIVITAMIN) tablet Take 1 tablet by mouth daily.     No current facility-administered medications for this visit.     Allergies:   Motrin [ibuprofen] and Shellfish allergy    Social History:  The patient  reports that he has quit smoking. He has never used smokeless tobacco. He reports that he does not drink alcohol or use drugs.   Family History:  The patient's family history includes COPD in his father; Cancer (age of onset: 50) in his sister; Dementia in his mother.     PHYSICAL EXAM: VS:  BP 118/80 (BP Location: Right Arm, Patient Position: Sitting, Cuff Size: Normal)   Ht 5\' 10"  (1.778 m)   Wt 195 lb 8 oz (88.7 kg)   BMI 28.05 kg/m  , BMI Body mass index is 28.05 kg/m. GEN: Well nourished, well developed, in no acute distress  HEENT: normal  Neck: no JVD, carotid bruits, or masses Cardiac: RRR; no  rubs, or gallops,no edema  . 1/6 SEM in aortic area Respiratory:  clear to auscultation bilaterally, normal work of breathing GI: soft, nontender, nondistended, + BS MS: no deformity or atrophy  Skin: warm and dry, no rash Neuro:  Strength and sensation are intact Psych: euthymic mood,  full affect   EKG:  EKG ordered today. EKG showed sinus bradycardia with left axis deviation.    Recent Labs: No results found for requested labs within last 8760 hours.    Lipid Panel    Component Value Date/Time   CHOL 154 04/17/2015 1528   TRIG 271 (H) 04/17/2015 1528   HDL 37 (L) 04/17/2015 1528   CHOLHDL 4.2 04/17/2015 1528   CHOLHDL 5 10/25/2013 0853   VLDL 10.8 10/25/2013 0853   LDLCALC 63 04/17/2015 1528      Wt Readings from Last 3 Encounters:  06/13/17 195 lb 8 oz (88.7 kg)  04/23/17 198 lb (89.8 kg)  05/22/16 195 lb (88.5 kg)      Other studies Reviewed: Additional  studies/ records that were reviewed today include: echocardiogram, CTA of the coronary arteries.    ASSESSMENT AND PLAN:  1.  Coronary artery disease involving native coronary arteries without angina: Cardiac catheterization showed no obstructive coronary artery disease other than mild plaque in the proximal LAD. Continue medical therapy.  2.  Bicuspid aortic valve: with no significant stenosis or regurgitation.  Repeat echocardiogram in 2020.  3. Ascending aortic aneurysm in the setting of bicuspid aortic valve: Most recent measurement was 46 mm.  The patient is going to have repeat CT in 6 months with Dr. Maren BeachVantrigt.  4. Hyperlipidemia: I refilled atorvastatin which he ran out of recently.  I requested routine labs to be done in 1 month.   Disposition:   FU with me in 12 months  Signed,  Lorine BearsMuhammad Quin Mcpherson, MD  06/13/2017 11:00 AM    [facility]

## 2017-06-13 NOTE — Patient Instructions (Signed)
Medication Instructions: Your physician recommends that you continue on your current medications as directed. Please refer to the Current Medication list given to you today.  If you need a refill on your cardiac medications before your next appointment, please call your pharmacy.   Labwork: Your provider would like for you to return in one month to have the following fasting labs drawn: BMET, CBC, LIPID and HEPATIC. [facility] and check in at the front desk. You do not need an appointment.    Follow-Up: Your physician wants you to follow-up in 12 months with Dr. Kirke CorinArida. You will receive a reminder letter in the mail two months in advance. If you don't receive a letter, please call our office at [phone] to schedule this follow-up appointment.   [facility]!

## 2017-06-18 ENCOUNTER — Telehealth: Payer: Self-pay | Admitting: Cardiovascular Disease

## 2017-06-18 NOTE — Telephone Encounter (Signed)
-----   Message from Joline MaxcyJennifer B Harkins sent at 06/13/2017  4:18 PM EDT ----- Regarding: Labs  1 m labs per checkout 6/7

## 2017-06-18 NOTE — Telephone Encounter (Signed)
Lmov for patient to call and schedule labs  °

## 2017-06-24 NOTE — Telephone Encounter (Signed)
No ans no vm   °

## 2017-06-26 NOTE — Telephone Encounter (Signed)
Lmov for patient to call and schedule labs  °

## 2017-07-02 ENCOUNTER — Encounter: Payer: Self-pay | Admitting: Cardiovascular Disease

## 2017-07-02 NOTE — Telephone Encounter (Signed)
Sent letter  Unable to contact

## 2017-07-16 ENCOUNTER — Other Ambulatory Visit (INDEPENDENT_AMBULATORY_CARE_PROVIDER_SITE_OTHER): Payer: BLUE CROSS/BLUE SHIELD | Admitting: *Deleted

## 2017-07-16 DIAGNOSIS — I2510 Atherosclerotic heart disease of native coronary artery without angina pectoris: Secondary | ICD-10-CM

## 2017-07-16 DIAGNOSIS — E785 Hyperlipidemia, unspecified: Secondary | ICD-10-CM

## 2017-07-17 LAB — LIPID PANEL
CHOLESTEROL TOTAL: 134 mg/dL (ref 100–199)
Chol/HDL Ratio: 3.3 ratio (ref 0.0–5.0)
HDL: 41 mg/dL (ref >39)
LDL Calculated: 83 mg/dL (ref 0–99)
TRIGLYCERIDES: 49 mg/dL (ref 0–149)
VLDL CHOLESTEROL CAL: 10 mg/dL (ref 5–40)

## 2017-07-17 LAB — CBC
Hematocrit: 43.7 % (ref 37.5–51.0)
Hemoglobin: 15.1 g/dL (ref 13.0–17.7)
MCH: 31.1 pg (ref 26.6–33.0)
MCHC: 34.6 g/dL (ref 31.5–35.7)
MCV: 90 fL (ref 79–97)
PLATELETS: 340 10*3/uL (ref 150–450)
RBC: 4.86 x10E6/uL (ref 4.14–5.80)
RDW: 13.4 % (ref 12.3–15.4)
WBC: 6.6 10*3/uL (ref 3.4–10.8)

## 2017-07-17 LAB — BASIC METABOLIC PANEL
BUN / CREAT RATIO: 11 (ref 9–20)
BUN: 10 mg/dL (ref 6–24)
CHLORIDE: 103 mmol/L (ref 96–106)
CO2: 26 mmol/L (ref 20–29)
Calcium: 9.7 mg/dL (ref 8.7–10.2)
Creatinine, Ser: 0.87 mg/dL (ref 0.76–1.27)
GFR calc Af Amer: 112 mL/min/{1.73_m2} (ref >59)
GFR calc non Af Amer: 97 mL/min/{1.73_m2} (ref >59)
GLUCOSE: 106 mg/dL — AB (ref 65–99)
Potassium: 5 mmol/L (ref 3.5–5.2)
SODIUM: 142 mmol/L (ref 134–144)

## 2017-07-17 LAB — HEPATIC FUNCTION PANEL
ALBUMIN: 4.4 g/dL (ref 3.5–5.5)
ALK PHOS: 106 IU/L (ref 39–117)
ALT: 18 IU/L (ref 0–44)
AST: 22 IU/L (ref 0–40)
Bilirubin Total: 0.6 mg/dL (ref 0.0–1.2)
Bilirubin, Direct: 0.15 mg/dL (ref 0.00–0.40)
Total Protein: 7 g/dL (ref 6.0–8.5)

## 2017-09-19 ENCOUNTER — Other Ambulatory Visit: Payer: Self-pay | Admitting: Cardiothoracic Surgery

## 2017-09-19 DIAGNOSIS — I712 Thoracic aortic aneurysm, without rupture, unspecified: Secondary | ICD-10-CM

## 2017-09-19 NOTE — Progress Notes (Signed)
Ct

## 2017-10-11 ENCOUNTER — Other Ambulatory Visit: Payer: Self-pay | Admitting: Cardiothoracic Surgery

## 2017-10-22 ENCOUNTER — Ambulatory Visit (INDEPENDENT_AMBULATORY_CARE_PROVIDER_SITE_OTHER): Payer: BLUE CROSS/BLUE SHIELD | Admitting: Cardiothoracic Surgery

## 2017-10-22 ENCOUNTER — Encounter: Payer: Self-pay | Admitting: Cardiothoracic Surgery

## 2017-10-22 ENCOUNTER — Ambulatory Visit
Admission: RE | Admit: 2017-10-22 | Discharge: 2017-10-22 | Disposition: A | Discharge status: Home / Self Care | Payer: BLUE CROSS/BLUE SHIELD | Source: Ambulatory Visit | Attending: Cardiothoracic Surgery | Admitting: Cardiothoracic Surgery

## 2017-10-22 VITALS — BP 113/73 | HR 61 | Resp 20 | Ht 70.0 in | Wt 205.0 lb

## 2017-10-22 DIAGNOSIS — I712 Thoracic aortic aneurysm, without rupture, unspecified: Secondary | ICD-10-CM

## 2017-10-22 DIAGNOSIS — I7121 Aneurysm of the ascending aorta, without rupture: Secondary | ICD-10-CM

## 2017-10-22 MED ORDER — IOPAMIDOL (ISOVUE-370) INJECTION 76%
75.0000 mL | Freq: Once | INTRAVENOUS | Status: AC | PRN
  Administered 2017-10-22: 75 mL via INTRAVENOUS

## 2017-10-22 NOTE — Progress Notes (Signed)
PCP is Sharilyn Sites, MD Referring Provider is Iran Ouch, MD  Chief Complaint  Patient presents with  . Thoracic Aortic Aneurysm    6 month f/u with CTA Chest    HPI: Scheduled visit with CTA of thoracic aorta for known 4.6 cm fusiform ascending aneurysm followed since 2017.  Patient has bicuspid aortic valve without stenosis.  He has normal LV function.  His valve is followed by Dr. Kirke Corin.  On his last visit 6 months ago he was hypertensive and was started on Toprol-XL 25 mg a day.  Today's blood pressure is fine 115/70 And he is taking his Toprol daily.  He has had some atypical brief chest pains.  Cardiac catheterization 2017 by Dr. Kirke Corin showed no significant CAD.  Past Medical History:  Diagnosis Date  . Hyperlipidemia     Past Surgical History:  Procedure Laterality Date  . CARDIAC CATHETERIZATION Left 11/06/2015   Procedure: Left Heart Cath and Coronary Angiography;  Surgeon: Iran Ouch, MD;  Location: [facility] INVASIVE CV LAB;  Service: Cardiovascular;  Laterality: Left;    Family History  Problem Relation Age of Onset  . Dementia Mother   . COPD Father   . Cancer Sister 29       breast    Social History Social History   Tobacco Use  . Smoking status: Former Games developer  . Smokeless tobacco: Never Used  Substance Use Topics  . Alcohol use: No    Comment: seldom  . Drug use: No    Current Outpatient Medications  Medication Sig Dispense Refill  . acetaminophen (TYLENOL) 325 MG tablet Take 650 mg by mouth every 6 (six) hours as needed for mild pain, moderate pain, fever or headache.    Marland Kitchen aspirin 81 MG chewable tablet Chew 81 mg by mouth daily.    Marland Kitchen atorvastatin (LIPITOR) 40 MG tablet Take 1 tablet (40 mg total) by mouth daily. 90 tablet 3  . metoprolol succinate (TOPROL-XL) 25 MG 24 hr tablet Take 25 mg by mouth daily.  6  . Multiple Vitamin (MULTIVITAMIN) tablet Take 1 tablet by mouth daily.     No current facility-administered medications for this  visit.     Allergies  Allergen Reactions  . Motrin [Ibuprofen] Other (See Comments)    Only high doses; skin tingling.  . Shellfish Allergy Other (See Comments)    Skin tingling.    Review of systems No syncope palpitations Brief atypical sharp chest pain with rest or exercise lasting less than 1 minute very infrequently  Patient has not had dental care in years.  He has a broken tooth.  He has known bicuspid aortic valve.  Is recommended to establish care with a dentist for regular dental hygiene and antibiotic prophylaxis prior to any gum invasive procedure.  He denies shortness of breath, ankle edema, syncope, difficulty swallowing, abdominal pain, TIA.  BP 113/73   Pulse 61   Resp 20   Ht 5\' 10"  (1.778 m)   Wt 205 lb (93 kg)   SpO2 96% Comment: RA  BMI 29.41 kg/m  Physical Exam       Exam    General- alert and comfortable    Neck- no JVD, no cervical adenopathy palpable, no carotid bruit   Lungs- clear without rales, wheezes   Cor- regular rate and rhythm, no murmur , gallop   Abdomen- soft, non-tender   Extremities - warm, non-tender, minimal edema   Neuro- oriented, appropriate, no focal weakness   Diagnostic Tests:  CTA images of thoracic aorta personally reviewed and counseled patient. His ascending aorta appears very smooth without wall thickening or ulceration or hematoma.  The diameter is stable at 4.6 cm.  Impression: No indication for aortic valve replacement nor ascending aortic replacement until ascending diameter > 5.0 cm or patient develops symptomatic- significant aortic stenosis.  Continue to follow with surveillance CTA.  Cardiology will follow the aortic valve with echocardiogram.  Plan: Return in 6 months with CTA of thoracic aorta.  Continue blood pressure medication and blood pressure monitoring.  Avoid lifting more than 50 pounds.  Recommend establishing care with a dentist for regular dental hygiene.   [name], MD [facility] [phone]

## 2017-11-06 ENCOUNTER — Emergency Department: Payer: BLUE CROSS/BLUE SHIELD

## 2017-11-06 ENCOUNTER — Encounter: Payer: Self-pay | Admitting: *Deleted

## 2017-11-06 ENCOUNTER — Other Ambulatory Visit: Payer: Self-pay

## 2017-11-06 DIAGNOSIS — I2510 Atherosclerotic heart disease of native coronary artery without angina pectoris: Secondary | ICD-10-CM | POA: Insufficient documentation

## 2017-11-06 DIAGNOSIS — Z7982 Long term (current) use of aspirin: Secondary | ICD-10-CM | POA: Insufficient documentation

## 2017-11-06 DIAGNOSIS — R0789 Other chest pain: Secondary | ICD-10-CM | POA: Diagnosis not present

## 2017-11-06 DIAGNOSIS — Z79899 Other long term (current) drug therapy: Secondary | ICD-10-CM | POA: Insufficient documentation

## 2017-11-06 DIAGNOSIS — R079 Chest pain, unspecified: Secondary | ICD-10-CM | POA: Diagnosis present

## 2017-11-06 LAB — CBC WITH DIFFERENTIAL/PLATELET
Abs Immature Granulocytes: 0.04 10*3/uL (ref 0.00–0.07)
Basophils Absolute: 0 10*3/uL (ref 0.0–0.1)
Basophils Relative: 0 %
EOS ABS: 0.1 10*3/uL (ref 0.0–0.5)
EOS PCT: 1 %
HEMATOCRIT: 42.3 % (ref 39.0–52.0)
Hemoglobin: 13.9 g/dL (ref 13.0–17.0)
Immature Granulocytes: 0 %
LYMPHS ABS: 1.6 10*3/uL (ref 0.7–4.0)
Lymphocytes Relative: 16 %
MCH: 30.2 pg (ref 26.0–34.0)
MCHC: 32.9 g/dL (ref 30.0–36.0)
MCV: 92 fL (ref 80.0–100.0)
MONOS PCT: 9 %
Monocytes Absolute: 0.9 10*3/uL (ref 0.1–1.0)
Neutro Abs: 7.2 10*3/uL (ref 1.7–7.7)
Neutrophils Relative %: 74 %
Platelets: 303 10*3/uL (ref 150–400)
RBC: 4.6 MIL/uL (ref 4.22–5.81)
RDW: 13.2 % (ref 11.5–15.5)
WBC: 9.8 10*3/uL (ref 4.0–10.5)
nRBC: 0 % (ref 0.0–0.2)

## 2017-11-06 LAB — BASIC METABOLIC PANEL
Anion gap: 9 (ref 5–15)
BUN: 13 mg/dL (ref 6–20)
CALCIUM: 9.2 mg/dL (ref 8.9–10.3)
CO2: 27 mmol/L (ref 22–32)
CREATININE: 0.94 mg/dL (ref 0.61–1.24)
Chloride: 104 mmol/L (ref 98–111)
GFR calc Af Amer: >60 mL/min (ref >60)
GFR calc non Af Amer: >60 mL/min (ref >60)
GLUCOSE: 127 mg/dL — AB (ref 70–99)
Potassium: 3.9 mmol/L (ref 3.5–5.1)
Sodium: 140 mmol/L (ref 135–145)

## 2017-11-06 LAB — TROPONIN I: Troponin I: <0.03 ng/mL (ref <0.03)

## 2017-11-06 NOTE — ED Triage Notes (Addendum)
Pt to ED reporting weakness, dizziness, SOB and chest pain that started today. Pt has hx of an aortic aneurysm and verbalized fears concerning a new complication with that.   No cough or congestion and no fevers reported.   Pt reporting general malaise for the past three days.

## 2017-11-07 ENCOUNTER — Emergency Department
Admission: EM | Admit: 2017-11-07 | Discharge: 2017-11-07 | Disposition: A | Discharge status: Home / Self Care | Payer: BLUE CROSS/BLUE SHIELD | Attending: Emergency Medicine | Admitting: Emergency Medicine

## 2017-11-07 DIAGNOSIS — R0789 Other chest pain: Secondary | ICD-10-CM

## 2017-11-07 LAB — TROPONIN I: Troponin I: <0.03 ng/mL (ref <0.03)

## 2017-11-07 NOTE — Discharge Instructions (Signed)
Fortunately today your EKG, your chest x-ray, and your lab work were reassuring.  Please take tomorrow off work and make an appointment to follow-up with your primary care physician this coming week for recheck.  Return to the emergency department sooner for any concerns.  It was a pleasure to take care of you today, and thank you for coming to our emergency department.  If you have any questions or concerns before leaving please ask the nurse to grab me and I'm more than happy to go through your aftercare instructions again.  If you were prescribed any opioid pain medication today such as Norco, Vicodin, Percocet, morphine, hydrocodone, or oxycodone please make sure you do not drive when you are taking this medication as it can alter your ability to drive safely.  If you have any concerns once you are home that you are not improving or are in fact getting worse before you can make it to your follow-up appointment, please do not hesitate to call 911 and come back for further evaluation.  Merrily Brittle, MD  Results for orders placed or performed during the hospital encounter of 11/07/17  CBC with Differential  Result Value Ref Range   WBC 9.8 4.0 - 10.5 K/uL   RBC 4.60 4.22 - 5.81 MIL/uL   Hemoglobin 13.9 13.0 - 17.0 g/dL   HCT 74.2 59.5 - 63.8 %   MCV 92.0 80.0 - 100.0 fL   MCH 30.2 26.0 - 34.0 pg   MCHC 32.9 30.0 - 36.0 g/dL   RDW 75.6 43.3 - 29.5 %   Platelets 303 150 - 400 K/uL   nRBC 0.0 0.0 - 0.2 %   Neutrophils Relative % 74 %   Neutro Abs 7.2 1.7 - 7.7 K/uL   Lymphocytes Relative 16 %   Lymphs Abs 1.6 0.7 - 4.0 K/uL   Monocytes Relative 9 %   Monocytes Absolute 0.9 0.1 - 1.0 K/uL   Eosinophils Relative 1 %   Eosinophils Absolute 0.1 0.0 - 0.5 K/uL   Basophils Relative 0 %   Basophils Absolute 0.0 0.0 - 0.1 K/uL   Immature Granulocytes 0 %   Abs Immature Granulocytes 0.04 0.00 - 0.07 K/uL  Basic metabolic panel  Result Value Ref Range   Sodium 140 135 - 145 mmol/L   Potassium 3.9 3.5 - 5.1 mmol/L   Chloride 104 98 - 111 mmol/L   CO2 27 22 - 32 mmol/L   Glucose, Bld 127 (H) 70 - 99 mg/dL   BUN 13 6 - 20 mg/dL   Creatinine, Ser 1.88 0.61 - 1.24 mg/dL   Calcium 9.2 8.9 - 41.6 mg/dL   GFR calc non Af Amer >60 >60 mL/min   GFR calc Af Amer >60 >60 mL/min   Anion gap 9 5 - 15  Troponin I  Result Value Ref Range   Troponin I <0.03 <0.03 ng/mL  Troponin I  Result Value Ref Range   Troponin I <0.03 <0.03 ng/mL   Dg Chest 2 View  Result Date: 11/06/2017 CLINICAL DATA:  57 year old male with chest pain. EXAM: CHEST - 2 VIEW COMPARISON:  Chest CT dated 10/22/2017 FINDINGS: Minimal bibasilar atelectatic changes/scarring. No focal consolidation, pleural effusion or pneumothorax. The cardiac silhouette is within normal limits. No acute osseous pathology. IMPRESSION: No active cardiopulmonary disease. Electronically Signed   By: Elgie Collard M.D.   On: 11/06/2017 21:16   Ct Angio Chest Aorta W/cm &/or Wo/cm  Result Date: 10/22/2017 CLINICAL DATA:  Follow-up thoracic aortic aneurysm  EXAM: CT ANGIOGRAPHY CHEST WITH CONTRAST TECHNIQUE: Multidetector CT imaging of the chest was performed using the standard protocol during bolus administration of intravenous contrast. Multiplanar CT image reconstructions and MIPs were obtained to evaluate the vascular anatomy. CONTRAST:  75mL ISOVUE-370 IOPAMIDOL (ISOVUE-370) INJECTION 76% COMPARISON:  04/23/2017 FINDINGS: Cardiovascular: Maximal diameter of the ascending aorta at the sinus of Valsalva, sino-tubular junction, and ascending aorta are 3.8 cm, 3.4 cm, and 4.5 cm. This compares with a previous maximal diameter of 4.6 cm. Diameter of the proximal aortic arch is 3.3 cm. There is no evidence of aortic dissection or acute intramural hematoma. Mild aortic valvular calcification. Great vessels are patent. Separate takeoff for the left vertebral artery directly from the arch. Right vertebral artery is patent. Left vertebral artery  is patent. There is no obvious acute pulmonary thromboembolism. Descending thoracic aorta is nonaneurysmal. No significant coronary artery calcifications. Mediastinum/Nodes: No abnormal mediastinal adenopathy. No pericardial effusion. Visualized thyroid is unremarkable. Esophagus is within normal limits. Lungs/Pleura: Clear lungs.  No pneumothorax or pleural effusion. Upper Abdomen: Flash filling 5 mm lesion at the dome of the liver on image 112 of series 4 is stable supporting benign etiology such as a hemangioma or focal nodular hyperplasia. No acute findings in the abdomen. Musculoskeletal: No vertebral compression deformity Review of the MIP images confirms the above findings. IMPRESSION: Maximal diameter of the ascending aorta is 4.5 cm compared with 4.6 cm on the prior study. This is not significantly changed. Ascending thoracic aortic aneurysm. Recommend semi-annual imaging followup by CTA or MRA and referral to cardiothoracic surgery if not already obtained. This recommendation follows 2010 ACCF/AHA/AATS/ACR/ASA/SCA/SCAI/SIR/STS/SVM Guidelines for the Diagnosis and Management of Patients With Thoracic Aortic Disease. Circulation. 2010; 121: Z610-R604 Electronically Signed   By: Jolaine Click M.D.   On: 10/22/2017 12:37

## 2017-11-07 NOTE — ED Provider Notes (Signed)
[facility] Emergency Department Provider Note  ____________________________________________   First MD Initiated Contact with Patient 11/07/17 0001     (approximate)  I have reviewed the triage vital signs and the nursing notes.   HISTORY  Chief Complaint Chest Pain   HPI Nicholas Juarez. is a 57 y.o. male who self presents to the emergency department with fatigue, lightheadedness, shortness of breath, and chest pain that began several hours ago while at work.  He has a history of thoracic aortic aneurysm and he is concerned that his aneurysm could be rupturing.  He was at work when he began to feel aching discomfort laterally on his left chest under his left nipple and around towards his left lateral side.  Symptoms were not sudden onset and not maximal onset.  They were not ripping or tearing and did not go straight to his back.  While at work he began to feel short of breath so he sat down which did seem to help somewhat.  His pain is essentially resolved at this point.  Is had no fevers or chills.  No cough.  No coryza.  No abdominal pain nausea or vomiting.    Past Medical History:  Diagnosis Date  . Hyperlipidemia     Patient Active Problem List   Diagnosis Date Noted  . Coronary artery disease involving native coronary artery of native heart with angina pectoris (HCC) 09/21/2015  . Trigger finger 09/21/2015  . Hyperlipidemia 09/21/2015  . Bicuspid aortic valve 03/09/2015  . Ascending aortic aneurysm (HCC) 02/01/2015  . Erectile dysfunction 10/25/2013  . Obstructive sleep apnea 04/01/2012    Past Surgical History:  Procedure Laterality Date  . CARDIAC CATHETERIZATION Left 11/06/2015   Procedure: Left Heart Cath and Coronary Angiography;  Surgeon: Iran Ouch, MD;  Location: [facility] INVASIVE CV LAB;  Service: Cardiovascular;  Laterality: Left;    Prior to Admission medications   Medication Sig Start Date End Date Taking? Authorizing  Provider  acetaminophen (TYLENOL) 325 MG tablet Take 650 mg by mouth every 6 (six) hours as needed for mild pain, moderate pain, fever or headache.    [provider]  aspirin 81 MG chewable tablet Chew 81 mg by mouth daily.    [provider]  atorvastatin (LIPITOR) 40 MG tablet Take 1 tablet (40 mg total) by mouth daily. 06/13/17   Iran Ouch, MD  metoprolol succinate (TOPROL-XL) 25 MG 24 hr tablet Take 25 mg by mouth daily. 04/23/17   [provider]  Multiple Vitamin (MULTIVITAMIN) tablet Take 1 tablet by mouth daily.    [provider]    Allergies Motrin [ibuprofen] and Shellfish allergy  Family History  Problem Relation Age of Onset  . Dementia Mother   . COPD Father   . Cancer Sister 64       breast    Social History Social History   Tobacco Use  . Smoking status: Former Games developer  . Smokeless tobacco: Never Used  Substance Use Topics  . Alcohol use: No    Comment: seldom  . Drug use: No    Review of Systems Constitutional: No fever/chills Eyes: No visual changes. ENT: No sore throat. Cardiovascular: Positive for chest pain. Respiratory: Positive for shortness of breath. Gastrointestinal: No abdominal pain.  No nausea, no vomiting.  No diarrhea.  No constipation. Genitourinary: Negative for dysuria. Musculoskeletal: Negative for back pain. Skin: Negative for rash. Neurological: Negative for headaches, focal weakness or numbness.   ____________________________________________  PHYSICAL EXAM:  VITAL SIGNS: ED Triage Vitals  Enc Vitals Group     BP 11/06/17 2035 110/73     Pulse Rate 11/06/17 2035 89     Resp 11/06/17 2035 16     Temp 11/06/17 2035 98.4 F (36.9 C)     Temp Source 11/06/17 2035 Oral     SpO2 11/06/17 2035 96 %     Weight 11/06/17 2042 205 lb (93 kg)     Height 11/06/17 2042 5\' 10"  (1.778 m)     Head Circumference --      Peak Flow --      Pain Score 11/06/17 2042 7     Pain Loc --      Pain  Edu? --      Excl. in GC? --     Constitutional: Alert and oriented x4 somewhat anxious appearing although nontoxic no diaphoresis speaks in full clear sentences Eyes: PERRL EOMI. Head: Atraumatic. Nose: No congestion/rhinnorhea. Mouth/Throat: No trismus Neck: No stridor.   Cardiovascular: Normal rate, regular rhythm. Grossly normal heart sounds.  Good peripheral circulation. Respiratory: Normal respiratory effort.  No retractions. Lungs CTAB and moving good air Gastrointestinal: Soft nontender Musculoskeletal: No lower extremity edema   Neurologic:  Normal speech and language. No gross focal neurologic deficits are appreciated. Skin:  Skin is warm, dry and intact. No rash noted.  Specifically no zoster rash Psychiatric: Somewhat anxious appearing    ____________________________________________   DIFFERENTIAL includes but not limited to  Aortic dissection, STEMI, non-STEMI, pulmonary embolism, gastritis, muscle spasm ____________________________________________   LABS (all labs ordered are listed, but only abnormal results are displayed)  Labs Reviewed  BASIC METABOLIC PANEL - Abnormal; Notable for the following components:      Result Value   Glucose, Bld 127 (*)    All other components within normal limits  CBC WITH DIFFERENTIAL/PLATELET  TROPONIN I  TROPONIN I    Lab work reviewed by me with no signs of acute ischemia x2 __________________________________________  EKG  ED ECG REPORT I, Merrily Brittle, the attending physician, personally viewed and interpreted this ECG.  Date: 11/07/2017 EKG Time:  Rate: 94 Rhythm: normal sinus rhythm QRS Axis: Leftward axis Intervals: normal ST/T Wave abnormalities: normal Narrative Interpretation: no evidence of acute ischemia  ____________________________________________  RADIOLOGY  Test x-ray reviewed by me with no acute disease ____________________________________________   PROCEDURES  Procedure(s) performed:  no  Procedures  Critical Care performed: no  ____________________________________________   INITIAL IMPRESSION / ASSESSMENT AND PLAN / ED COURSE  Pertinent labs & imaging results that were available during my care of the patient were reviewed by me and considered in my medical decision making (see chart for details).   As part of my medical decision making, I reviewed the following data within the electronic MEDICAL RECORD NUMBER History obtained from family if available, nursing notes, old chart and ekg, as well as notes from prior ED visits.  The patient comes to the emergency department with several hours of atypical chest pain.  Pain is nonexertional.  His primary concern at this point is his thoracic aortic aneurysm.  He had a surveillance CT scan performed 10 days ago and is actually shown that the aneurysm is slightly decreased from previous down to 4.6 cm.  I pulled up the imaging from that CT and had a lengthy discussion with the patient and his wife at bedside regarding the aneurysm and that thoracic aortic aneurysms typically do not cause pain unless they are actively  dissecting.  His symptoms are not consistent with aortic dissection at this point.  More concerning actually would be a non-STEMI as he does have a history of coronary artery disease.  First troponin is negative and is already been more than 3 hours so I will check a second in the meantime.  He declines pain medication at this point.    Fortunately the patient second troponin is negative.  He feels significant reassurance.  Of advised him to take the day off of work tomorrow.  Strict return precautions have been given and the patient verbalizes understanding agreement with plan.  ____________________________________________   FINAL CLINICAL IMPRESSION(S) / ED DIAGNOSES  Final diagnoses:  Atypical chest pain      NEW MEDICATIONS STARTED DURING THIS VISIT:  Discharge Medication List as of 11/07/2017  1:24 AM        Note:  This document was prepared using Dragon voice recognition software and may include unintentional dictation errors.     Merrily Brittle, MD 11/07/17 0800

## 2018-03-10 ENCOUNTER — Other Ambulatory Visit: Payer: Self-pay | Admitting: *Deleted

## 2018-03-10 DIAGNOSIS — I712 Thoracic aortic aneurysm, without rupture, unspecified: Secondary | ICD-10-CM

## 2018-04-29 ENCOUNTER — Ambulatory Visit: Payer: BLUE CROSS/BLUE SHIELD | Admitting: Cardiothoracic Surgery

## 2018-04-29 ENCOUNTER — Other Ambulatory Visit: Payer: BLUE CROSS/BLUE SHIELD

## 2018-05-27 ENCOUNTER — Other Ambulatory Visit: Payer: Self-pay | Admitting: Cardiovascular Disease

## 2018-06-02 ENCOUNTER — Other Ambulatory Visit: Payer: Self-pay

## 2018-06-03 ENCOUNTER — Ambulatory Visit
Admission: RE | Admit: 2018-06-03 | Discharge: 2018-06-03 | Disposition: A | Discharge status: Home / Self Care | Payer: BLUE CROSS/BLUE SHIELD | Source: Ambulatory Visit | Attending: Cardiothoracic Surgery | Admitting: Cardiothoracic Surgery

## 2018-06-03 ENCOUNTER — Encounter: Payer: Self-pay | Admitting: Cardiothoracic Surgery

## 2018-06-03 ENCOUNTER — Ambulatory Visit (INDEPENDENT_AMBULATORY_CARE_PROVIDER_SITE_OTHER): Payer: BLUE CROSS/BLUE SHIELD | Admitting: Cardiothoracic Surgery

## 2018-06-03 VITALS — BP 129/84 | HR 59 | Temp 97.2°F | Resp 16 | Ht 70.0 in | Wt 211.0 lb

## 2018-06-03 DIAGNOSIS — I712 Thoracic aortic aneurysm, without rupture, unspecified: Secondary | ICD-10-CM

## 2018-06-03 MED ORDER — IOPAMIDOL (ISOVUE-370) INJECTION 76%
75.0000 mL | Freq: Once | INTRAVENOUS | Status: AC | PRN
  Administered 2018-06-03: 75 mL via INTRAVENOUS

## 2018-06-03 NOTE — Progress Notes (Signed)
PCP is Nicholas Juarez, Mark, MD Referring Provider is Nicholas Juarez, Nicholas A, MD  Chief Complaint  Patient presents with  . TAA    6 month f/u with CTA CHEST today    HPI: 4111-month follow-up with CTA to assess moderate fusiform ascending thoracic aneurysm measuring 4.5-4.7cm since 2017.  It is asymptomatic.  The patient has treated hypertension taking Toprol-XL 25 mg daily.  He has had Juarez previous echo showing Juarez bicuspid aortic valve without stenosis.  Previous cardiac catheterization has shown no significant CAD.  The patient continues to work full-time in [employer].  He avoids heavy lifting.  Today's CTA shows stable fusiform aneurysm measuring 4.6 cm diameter.  There is no mural thickening or ulceration.  The arch and descending thoracic aorta appear normal.  Lung windows are clear. Past Medical History:  Diagnosis Date  . Hyperlipidemia     Past Surgical History:  Procedure Laterality Date  . CARDIAC CATHETERIZATION Left 11/06/2015   Procedure: Left Heart Cath and Coronary Angiography;  Surgeon: Nicholas OuchMuhammad Juarez Arida, MD;  Location: [facility] INVASIVE CV LAB;  Service: Cardiovascular;  Laterality: Left;    Family History  Problem Relation Age of Onset  . Dementia Mother   . COPD Father   . Cancer Sister [id number]       breast    Social History Social History   Tobacco Use  . Smoking status: Former Games developermoker  . Smokeless tobacco: Never Used  Substance Use Topics  . Alcohol use: No    Comment: seldom  . Drug use: No    Current Outpatient Medications  Medication Sig Dispense Refill  . acetaminophen (TYLENOL) 325 MG tablet Take 650 mg by mouth every 6 (six) hours as needed for mild pain, moderate pain, fever or headache.    Marland Kitchen. aspirin 81 MG chewable tablet Chew 81 mg by mouth daily.    Marland Kitchen. atorvastatin (LIPITOR) 40 MG tablet TAKE 1 TABLET BY MOUTH EVERY DAY 90 tablet 0  . metoprolol succinate (TOPROL-XL) 25 MG 24 hr tablet Take 25 mg by mouth daily.  6  . Multiple Vitamin (MULTIVITAMIN)  tablet Take 1 tablet by mouth daily.     No current facility-administered medications for this visit.     Allergies  Allergen Reactions  . Motrin [Ibuprofen] Other (See Comments)    Only high doses; skin tingling.  . Shellfish Allergy Other (See Comments)    Skin tingling.    Review of Systems   Some exposure to COVID at work but no symptoms. Slight weight gain of 5 pounds in the past 6 months. No edema No dizziness syncope No dyspnea orthopnea or PND  BP 129/84 (BP Location: Right Arm, Patient Position: Sitting, Cuff Size: Normal)   Pulse (!) 59   Temp (!) 97.2 F (36.2 C) (Skin)   Resp 16   Ht 5\' 10"  (1.778 m)   Wt 211 lb (95.7 kg)   SpO2 96% Comment: ON RA  BMI 30.28 kg/m  Physical Exam      Exam    General- alert and comfortable    Neck- no JVD, no cervical adenopathy palpable, no carotid bruit   Lungs- clear without rales, wheezes   Cor- regular rate and rhythm, no murmur , gallop   Abdomen- soft, non-tender   Extremities - warm, non-tender, minimal edema   Neuro- oriented, appropriate, no focal weakness  Diagnostic Tests: Images of CTA performed today personally reviewed and discussed with patient.  Stable 4.6 cm fusiform aneurysm.  Impression: Current risk  for dissection is less than 2 %/year.  Best therapy is blood pressure control as he is accomplishing.  Continue with annual scanning CT scans of his thoracic aorta.  Surgery not recommended until diameter exceeds 5 cm.  Plan: Return in 1 year with CTA of thoracic aorta.   Nicholas Bussing, MD Nicholas Juarez [phone]

## 2018-07-29 ENCOUNTER — Encounter: Payer: Self-pay | Admitting: Nurse Practitioner

## 2018-07-29 ENCOUNTER — Ambulatory Visit (INDEPENDENT_AMBULATORY_CARE_PROVIDER_SITE_OTHER): Payer: BC Managed Care – PPO | Admitting: Nurse Practitioner

## 2018-07-29 ENCOUNTER — Other Ambulatory Visit: Payer: Self-pay

## 2018-07-29 VITALS — BP 132/68 | HR 68 | Ht 69.5 in | Wt 205.0 lb

## 2018-07-29 DIAGNOSIS — Q231 Congenital insufficiency of aortic valve: Secondary | ICD-10-CM | POA: Diagnosis not present

## 2018-07-29 DIAGNOSIS — I7121 Aneurysm of the ascending aorta, without rupture: Secondary | ICD-10-CM

## 2018-07-29 DIAGNOSIS — I2510 Atherosclerotic heart disease of native coronary artery without angina pectoris: Secondary | ICD-10-CM | POA: Diagnosis not present

## 2018-07-29 DIAGNOSIS — I712 Thoracic aortic aneurysm, without rupture: Secondary | ICD-10-CM | POA: Diagnosis not present

## 2018-07-29 NOTE — Progress Notes (Signed)
Office Visit    Patient Name: Nicholas JourneyJames W Greenwalt Jr. Date of Encounter: 07/29/2018  Primary Care Provider:  Sharilyn SitesHeffington, Mark, MD Primary Cardiologist:  Lorine BearsMuhammad Arida, MD  Chief Complaint    58 y/o ? with a history of a sending aortic aneurysm, bicuspid aortic valve, nonobstructive CAD, chronic left-sided chest wall pain, and hyperlipidemia, who presents for follow-up related to bicuspid aortic valve.  Past Medical History    Past Medical History:  Diagnosis Date  . Ascending aortic aneurysm (HCC)    a. 05/2018 CTA Chest: 4.7 x 4.7 cm Asc thor Ao - not significantly changed.  . Bicuspid aortic valve    a. 10/2016 Echo: Ef 55-60%, no rwma. Bicuspid AoV. Mildly dil Ao root (3.8cm), mildly to mod dil Asc Ao 4-4.4cm. Nl RV fxn.  . Hyperlipidemia   . Non-obstructive CAD (coronary artery disease)    a. 10/2015 Cath: LAD 30p, otw nl cors. Nl EF.   Past Surgical History:  Procedure Laterality Date  . CARDIAC CATHETERIZATION Left 11/06/2015   Procedure: Left Heart Cath and Coronary Angiography;  Surgeon: Iran OuchMuhammad A Arida, MD;  Location: [facility] INVASIVE CV LAB;  Service: Cardiovascular;  Laterality: Left;    Allergies  Allergies  Allergen Reactions  . Motrin [Ibuprofen] Other (See Comments)    Only high doses; skin tingling.  . Shellfish Allergy Other (See Comments)    Skin tingling.    History of Present Illness    58 year old male with the above past medical history including ascending aortic aneurysm, bicuspid aortic valve, mild nonobstructive CAD, chronic left-sided chest wall pain, and hyperlipidemia.  He underwent diagnostic catheterization in 2017 in the setting of left-sided chest pain and this showed minimal LAD disease.  He has been medically managed with aspirin and statin therapy.  Regarding his ascending aortic aneurysm, he is followed by thoracic surgery with his last CT angiogram in May of this year showing stable 4.7 x 4.7 cm ascending thoracic aortic aneurysm.  His  last echocardiogram was performed in October 2018 showing normal LV function with bicuspid aortic valve.  He is due for a follow-up echocardiogram this year.  He was last seen in clinic in June 2019 and since then has done reasonably well.  His work requires a fair amount of exertion and though he continues to have left-sided axillary discomfort with some activities, it does not limit his activity.  He denies dyspnea, palpitations, PND, orthopnea, dizziness, syncope, edema, or early satiety.  Home Medications    Prior to Admission medications   Medication Sig Start Date End Date Taking? Authorizing Provider  acetaminophen (TYLENOL) 325 MG tablet Take 650 mg by mouth every 6 (six) hours as needed for mild pain, moderate pain, fever or headache.   Yes [provider]  aspirin 81 MG chewable tablet Chew 81 mg by mouth daily.   Yes [provider]  atorvastatin (LIPITOR) 40 MG tablet TAKE 1 TABLET BY MOUTH EVERY DAY 05/27/18  Yes Iran OuchArida, Muhammad A, MD  metoprolol succinate (TOPROL-XL) 25 MG 24 hr tablet Take 25 mg by mouth daily. 04/23/17  Yes [provider]  Multiple Vitamin (MULTIVITAMIN) tablet Take 1 tablet by mouth daily.   Yes [provider]    Review of Systems    Chronic left-sided chest wall and left axillary discomfort.  He denies dyspnea, palpitations, PND, orthopnea, dizziness, syncope, edema, or early satiety..  All other systems reviewed and are otherwise negative except as noted above.  Physical Exam  VS:  BP 132/68 (BP Location: Left Arm, Patient Position: Sitting, Cuff Size: Normal)   Pulse 68   Ht 5' 9.5" (1.765 m)   Wt 205 lb (93 kg)   SpO2 98%   BMI 29.84 kg/m  , BMI Body mass index is 29.84 kg/m. GEN: Well nourished, well developed, in no acute distress. HEENT: normal. Neck: Supple, no JVD, carotid bruits, or masses. Cardiac: RRR, no murmurs, rubs, or gallops. No clubbing, cyanosis, edema.  Radials/DP/PT 2+ and equal bilaterally.   Respiratory:  Respirations regular and unlabored, clear to auscultation bilaterally. GI: Soft, nontender, nondistended, BS + x 4. MS: no deformity or atrophy. Skin: warm and dry, no rash. Neuro:  Strength and sensation are intact. Psych: Normal affect.  Accessory Clinical Findings    ECG personally reviewed by me today -regular sinus rhythm, 60, left axis deviation, question prior inferior infarct - no acute changes.  Lab Results  Component Value Date   WBC 9.8 11/06/2017   HGB 13.9 11/06/2017   HCT 42.3 11/06/2017   MCV 92.0 11/06/2017   PLT 303 11/06/2017   Lab Results  Component Value Date   CREATININE 0.94 11/06/2017   BUN 13 11/06/2017   NA 140 11/06/2017   K 3.9 11/06/2017   CL 104 11/06/2017   CO2 27 11/06/2017   Lab Results  Component Value Date   ALT 18 07/16/2017   AST 22 07/16/2017   ALKPHOS 106 07/16/2017   BILITOT 0.6 07/16/2017   Lab Results  Component Value Date   CHOL 134 07/16/2017   HDL 41 07/16/2017   LDLCALC 83 07/16/2017   TRIG 49 07/16/2017   CHOLHDL 3.3 07/16/2017     Assessment & Plan    1.  Bicuspid aortic valve: Patient has been doing well without dyspnea presyncope or syncope.  He does have chronic left-sided chest and axillary discomfort with nonobstructive CAD by prior catheterization October 2017.  He is due for follow-up echocardiogram this year and I will arrange.  2.  Ascending aortic aneurysm: Stable at 4.7 x 4.7 cm on recent CTA in May of this year.  He is followed closely by thoracic surgery.  He remains on beta-blocker therapy with stable blood pressure today.  I did ask him to check his blood pressure at home a few times a week and contact us if pressures are consistently in the 130s, at which point, we would likely titrate beta-blocker further.  3.  Hyperlipidemia: Last LDL was 83 in July 2019.  He is due for follow-up lipids and LFTs and we will obtain these today.  4.  Nonobstructive CAD: No chest pain.  Continue aspirin  and statin.  5.  Disposition: Follow-up echocardiogram as planned.  Follow-up in clinic in 1 year or sooner if necessary.   Murray Hodgkins, NP 07/29/2018, 5:55 PM

## 2018-07-29 NOTE — Patient Instructions (Signed)
Medication Instructions:  Your physician recommends that you continue on your current medications as directed. Please refer to the Current Medication list given to you today.  If you need a refill on your cardiac medications before your next appointment, please call your pharmacy.   Lab work: Your physician recommends that you have lab work today(Lipid w direct, LFT)   If you have labs (blood work) drawn today and your tests are completely normal, you will receive your results only by: Marland Kitchen MyChart Message (if you have MyChart) OR . A paper copy in the mail If you have any lab test that is abnormal or we need to change your treatment, we will call you to review the results.  Testing/Procedures: 1- Echo  Please return to [facility] on ______________ at _______________ AM/PM for an Echocardiogram. Your physician has requested that you have an echocardiogram. Echocardiography is a painless test that uses sound waves to create images of your heart. It provides your doctor with information about the size and shape of your heart and how well your heart's chambers and valves are working. This procedure takes approximately one hour. There are no restrictions for this procedure. Please note; depending on visual quality an IV may need to be placed.    Follow-Up: At [facility], you and your health needs are our priority.  As part of our continuing mission to provide you with exceptional heart care, we have created designated Provider Care Teams.  These Care Teams include your primary Cardiologist (physician) and Advanced Practice Providers (APPs -  Physician Assistants and Nurse Practitioners) who all work together to provide you with the care you need, when you need it. You will need a follow up appointment in 12 months.  Please call our office 2 months in advance to schedule this appointment.  You may see Kathlyn Sacramento, MD or Murray Hodgkins, NP.

## 2018-07-30 LAB — HEPATIC FUNCTION PANEL
ALT: 25 IU/L (ref 0–44)
AST: 23 IU/L (ref 0–40)
Albumin: 4.7 g/dL (ref 3.8–4.9)
Alkaline Phosphatase: 101 IU/L (ref 39–117)
Bilirubin Total: 0.4 mg/dL (ref 0.0–1.2)
Bilirubin, Direct: 0.11 mg/dL (ref 0.00–0.40)
Total Protein: 7.2 g/dL (ref 6.0–8.5)

## 2018-07-30 LAB — LIPID PANEL
Chol/HDL Ratio: 3.8 ratio (ref 0.0–5.0)
Cholesterol, Total: 145 mg/dL (ref 100–199)
HDL: 38 mg/dL — ABNORMAL LOW (ref >39)
LDL Calculated: 64 mg/dL (ref 0–99)
Triglycerides: 215 mg/dL — ABNORMAL HIGH (ref 0–149)
VLDL Cholesterol Cal: 43 mg/dL — ABNORMAL HIGH (ref 5–40)

## 2018-07-30 LAB — LDL CHOLESTEROL, DIRECT: LDL Direct: 76 mg/dL (ref 0–99)

## 2018-08-18 ENCOUNTER — Other Ambulatory Visit: Payer: Self-pay | Admitting: Cardiovascular Disease

## 2018-09-02 ENCOUNTER — Telehealth: Payer: Self-pay | Admitting: Nurse Practitioner

## 2018-09-02 NOTE — Telephone Encounter (Signed)
3 attempts to schedule Echo .   Removing order from wq.

## 2019-04-08 DIAGNOSIS — U071 COVID-19: Secondary | ICD-10-CM

## 2019-04-08 HISTORY — DX: COVID-19: U07.1

## 2019-04-20 ENCOUNTER — Other Ambulatory Visit: Payer: Self-pay | Admitting: Cardiothoracic Surgery

## 2019-04-20 DIAGNOSIS — I712 Thoracic aortic aneurysm, without rupture, unspecified: Secondary | ICD-10-CM

## 2019-05-19 ENCOUNTER — Other Ambulatory Visit: Payer: BC Managed Care – PPO

## 2019-05-19 ENCOUNTER — Ambulatory Visit: Payer: BC Managed Care – PPO | Admitting: Cardiothoracic Surgery

## 2019-07-01 ENCOUNTER — Other Ambulatory Visit: Payer: Self-pay

## 2019-07-01 ENCOUNTER — Ambulatory Visit
Admission: RE | Admit: 2019-07-01 | Discharge: 2019-07-01 | Disposition: A | Discharge status: Home / Self Care | Payer: No Typology Code available for payment source | Source: Ambulatory Visit | Attending: Family Medicine | Admitting: Family Medicine

## 2019-07-01 VITALS — BP 92/70 | HR 85 | Temp 98.1°F | Resp 18 | Ht 70.0 in | Wt 211.0 lb

## 2019-07-01 DIAGNOSIS — S301XXA Contusion of abdominal wall, initial encounter: Secondary | ICD-10-CM | POA: Diagnosis not present

## 2019-07-01 DIAGNOSIS — S30811A Abrasion of abdominal wall, initial encounter: Secondary | ICD-10-CM | POA: Diagnosis not present

## 2019-07-01 NOTE — Discharge Instructions (Signed)
Rest. ° °Tylenol as needed. ° °Take care ° °Dr. Dezmon Conover  °

## 2019-07-01 NOTE — ED Provider Notes (Signed)
[facility]    CSN: [id number] Arrival date & time: 07/01/19  1659      History   Chief Complaint Chief Complaint  Patient presents with  . Appointment  . Fall  . Abrasion   HPI 59 year old male presents with the above complaints.  Patient suffered a fall while at work today.  This is a Designer, multimedia. injury.  Patient states that he is unsure of exactly how he fell but when he did so he fell backwards and hit his abdomen was injured by some pieces of metal that he was holding.  He has abrasions and bruising to the left side of his abdomen.  He reports mild discomfort.  Pain 3/10 in severity.  No relieving factors.  Patient describes the pain as a burning sensation.  Patient was encouraged to come in for evaluation by his family members.  No other complaints at this time.  Past Medical History:  Diagnosis Date  . Ascending aortic aneurysm (HCC)    a. 05/2018 CTA Chest: 4.7 x 4.7 cm Asc thor Ao - not significantly changed.  . Bicuspid aortic valve    a. 10/2016 Echo: Ef 55-60%, no rwma. Bicuspid AoV. Mildly dil Ao root (3.8cm), mildly to mod dil Asc Ao 4-4.4cm. Nl RV fxn.  Marland Kitchen COVID-19 04/2019  . Hyperlipidemia   . Non-obstructive CAD (coronary artery disease)    a. 10/2015 Cath: LAD 30p, otw nl cors. Nl EF.    Patient Active Problem List   Diagnosis Date Noted  . Coronary artery disease involving native coronary artery of native heart with angina pectoris (HCC) 09/21/2015  . Trigger finger 09/21/2015  . Hyperlipidemia 09/21/2015  . Bicuspid aortic valve 03/09/2015  . Ascending aortic aneurysm (HCC) 02/01/2015  . Erectile dysfunction 10/25/2013  . Obstructive sleep apnea 04/01/2012    Past Surgical History:  Procedure Laterality Date  . CARDIAC CATHETERIZATION Left 11/06/2015   Procedure: Left Heart Cath and Coronary Angiography;  Surgeon: Iran Ouch, MD;  Location: [facility] INVASIVE CV LAB;  Service: Cardiovascular;  Laterality: Left;       Home  Medications    Prior to Admission medications   Medication Sig Start Date End Date Taking? Authorizing Provider  acetaminophen (TYLENOL) 325 MG tablet Take 650 mg by mouth every 6 (six) hours as needed for mild pain, moderate pain, fever or headache.   Yes [provider]  aspirin 81 MG chewable tablet Chew 81 mg by mouth daily.   Yes [provider]  atorvastatin (LIPITOR) 40 MG tablet TAKE 1 TABLET BY MOUTH EVERY DAY 08/18/18  Yes Iran Ouch, MD  metoprolol succinate (TOPROL-XL) 25 MG 24 hr tablet Take 25 mg by mouth daily. 04/23/17  Yes [provider]  Multiple Vitamin (MULTIVITAMIN) tablet Take 1 tablet by mouth daily.   Yes [provider]    Family History Family History  Problem Relation Age of Onset  . Dementia Mother   . COPD Father   . Cancer Sister 42       breast    Social History Social History   Tobacco Use  . Smoking status: Former Games developer  . Smokeless tobacco: Never Used  Vaping Use  . Vaping Use: Never used  Substance Use Topics  . Alcohol use: No    Comment: seldom  . Drug use: No     Allergies   Motrin [ibuprofen] and Shellfish allergy   Review of Systems Review of Systems  Skin:  Bruising, abrasions - abdomen.   Physical Exam Triage Vital Signs ED Triage Vitals  Enc Vitals Group     BP 07/01/19 1721 92/70     Pulse Rate 07/01/19 1721 85     Resp 07/01/19 1721 18     Temp 07/01/19 1721 98.1 F (36.7 C)     Temp Source 07/01/19 1721 Oral     SpO2 07/01/19 1721 97 %     Weight 07/01/19 1719 211 lb (95.7 kg)     Height 07/01/19 1719 5\' 10"  (1.778 m)     Head Circumference --      Peak Flow --      Pain Score 07/01/19 1719 3     Pain Loc --      Pain Edu? --      Excl. in GC? --    Updated Vital Signs BP 92/70 (BP Location: Right Arm)   Pulse 85   Temp 98.1 F (36.7 C) (Oral)   Resp 18   Ht 5\' 10"  (1.778 m)   Wt 95.7 kg   SpO2 97%   BMI 30.28 kg/m   Visual Acuity Right Eye  Distance:   Left Eye Distance:   Bilateral Distance:    Right Eye Near:   Left Eye Near:    Bilateral Near:     Physical Exam Vitals and nursing note reviewed.  Constitutional:      General: He is not in acute distress.    Appearance: Normal appearance. He is not ill-appearing.  HENT:     Head: Normocephalic and atraumatic.  Eyes:     General:        Right eye: No discharge.        Left eye: No discharge.     Conjunctiva/sclera: Conjunctivae normal.  Cardiovascular:     Rate and Rhythm: Normal rate and regular rhythm.  Pulmonary:     Effort: Pulmonary effort is normal. No respiratory distress.     Breath sounds: Normal breath sounds. No wheezing or rales.  Abdominal:     Comments: Left lower abdomen with abrasions and contusion.  Neurological:     Mental Status: He is alert.  Psychiatric:        Mood and Affect: Mood normal.        Behavior: Behavior normal.    UC Treatments / Results  Labs (all labs ordered are listed, but only abnormal results are displayed) Labs Reviewed - No data to display  EKG   Radiology No results found.  Procedures Procedures (including critical care time)  Medications Ordered in UC Medications - No data to display  Initial Impression / Assessment and Plan / UC Course  I have reviewed the triage vital signs and the nursing notes.  Pertinent labs & imaging results that were available during my care of the patient were reviewed by me and considered in my medical decision making (see chart for details).    59 year old male presents with abrasions and contusion to the left side of the abdominal wall.  Superficial injury.  He is well-appearing otherwise.  Workmen's Comp. form filled out.  Rest and Tylenol.  Supportive care.  Final Clinical Impressions(s) / UC Diagnoses   Final diagnoses:  Abrasion of abdominal wall, initial encounter  Contusion of abdominal wall, initial encounter     Discharge Instructions     Rest.    Tylenol as needed.  Take care  Dr.    ED Prescriptions    None  PDMP not reviewed this encounter.   Coral Spikes, [place] 07/01/19 Bosie Helper

## 2019-07-01 NOTE — ED Triage Notes (Signed)
Patient fell at work today. While he fell some metal pieces fell on the left side of his abdomen. He has bruising and abrasions to his left abdomen area.

## 2019-07-20 ENCOUNTER — Ambulatory Visit
Admission: RE | Admit: 2019-07-20 | Discharge: 2019-07-20 | Disposition: A | Discharge status: Home / Self Care | Payer: BC Managed Care – PPO | Source: Ambulatory Visit | Attending: Cardiothoracic Surgery | Admitting: Cardiothoracic Surgery

## 2019-07-20 DIAGNOSIS — I712 Thoracic aortic aneurysm, without rupture, unspecified: Secondary | ICD-10-CM

## 2019-07-20 MED ORDER — IOPAMIDOL (ISOVUE-370) INJECTION 76%
75.0000 mL | Freq: Once | INTRAVENOUS | Status: AC | PRN
  Administered 2019-07-20: 75 mL via INTRAVENOUS

## 2019-07-21 ENCOUNTER — Encounter: Payer: Self-pay | Admitting: Cardiothoracic Surgery

## 2019-07-21 ENCOUNTER — Other Ambulatory Visit: Payer: Self-pay

## 2019-07-21 ENCOUNTER — Ambulatory Visit (INDEPENDENT_AMBULATORY_CARE_PROVIDER_SITE_OTHER): Payer: BC Managed Care – PPO | Admitting: Cardiothoracic Surgery

## 2019-07-21 VITALS — BP 120/79 | HR 80 | Temp 98.4°F | Resp 20 | Ht 70.0 in | Wt 213.0 lb

## 2019-07-21 DIAGNOSIS — I7121 Aneurysm of the ascending aorta, without rupture: Secondary | ICD-10-CM

## 2019-07-21 DIAGNOSIS — I712 Thoracic aortic aneurysm, without rupture: Secondary | ICD-10-CM

## 2019-07-21 NOTE — Progress Notes (Signed)
PCP is Sharilyn Sites, MD Referring Provider is Iran Ouch, MD  Chief Complaint  Patient presents with  . Thoracic Aortic Aneurysm    1 year f/u CTA Chest 07/20/19    HPI: Patient returns for annual exam with CTA of thoracic aorta for an asymptomatic 4.7 cm fusiform ascending aneurysm which has been stable since it was first noted in 2017.  Patient has very mild hypertension and takes Toprol-XL 25 mg daily.  A previous echocardiogram demonstrated a bicuspid aortic valve with a 3.8 cm aortic root, normal LV function, and no AS or AI.   Personally reviewed the CTA performed today in the aorta remains moderately dilated 4.7.  The aorta is without ulceration or mural thrombus. Past Medical History:  Diagnosis Date  . Ascending aortic aneurysm (HCC)    a. 05/2018 CTA Chest: 4.7 x 4.7 cm Asc thor Ao - not significantly changed.  . Bicuspid aortic valve    a. 10/2016 Echo: Ef 55-60%, no rwma. Bicuspid AoV. Mildly dil Ao root (3.8cm), mildly to mod dil Asc Ao 4-4.4cm. Nl RV fxn.  Marland Kitchen COVID-19 04/2019  . Hyperlipidemia   . Non-obstructive CAD (coronary artery disease)    a. 10/2015 Cath: LAD 30p, otw nl cors. Nl EF.    Past Surgical History:  Procedure Laterality Date  . CARDIAC CATHETERIZATION Left 11/06/2015   Procedure: Left Heart Cath and Coronary Angiography;  Surgeon: Iran Ouch, MD;  Location: [facility] INVASIVE CV LAB;  Service: Cardiovascular;  Laterality: Left;    Family History  Problem Relation Age of Onset  . Dementia Mother   . COPD Father   . Cancer Sister 10       breast    Social History Social History   Tobacco Use  . Smoking status: Former Games developer  . Smokeless tobacco: Never Used  Vaping Use  . Vaping Use: Never used  Substance Use Topics  . Alcohol use: No    Comment: seldom  . Drug use: No    Current Outpatient Medications  Medication Sig Dispense Refill  . acetaminophen (TYLENOL) 325 MG tablet Take 650 mg by mouth every 6 (six) hours as needed  for mild pain, moderate pain, fever or headache.    Marland Kitchen aspirin 81 MG chewable tablet Chew 81 mg by mouth daily.    Marland Kitchen atorvastatin (LIPITOR) 40 MG tablet TAKE 1 TABLET BY MOUTH EVERY DAY 90 tablet 3  . metoprolol succinate (TOPROL-XL) 25 MG 24 hr tablet Take 25 mg by mouth daily.  6  . Multiple Vitamin (MULTIVITAMIN) tablet Take 1 tablet by mouth daily.     No current facility-administered medications for this visit.    Allergies  Allergen Reactions  . Motrin [Ibuprofen] Other (See Comments)    Only high doses; skin tingling.  . Shellfish Allergy Other (See Comments)    Skin tingling.    Review of Systems                     Review of Systems :  [ y ] = yes, [  ] = no        General :  Weight gain [6 pound weight gain over the past year]    Weight loss  [   ]  Fatigue [  ]  Fever [  ]  Chills  [  ]  HEENT    Headache [  ]  Dizziness [ y episodic orthostatic dizziness]  Blurred vision [  ] Glaucoma  [  ]                          Nosebleeds [  ] Painful or loose teeth [  ]        Cardiac :  Chest pain/ pressure [  ]  Resting SOB [  ] exertional SOB [  ]                        Orthopnea [  ]  Pedal edema  [  ]  Palpitations [  ] Syncope/presyncope [ ]                         Paroxysmal nocturnal dyspnea [  ]         Pulmonary : cough [  ]  wheezing [  ]  Hemoptysis [  ] Sputum [  ] Snoring [  ]                              Pneumothorax [  ]  Sleep apnea [  ]        GI : Vomiting [  ]  Dysphagia [  ]  Melena  [  ]  Abdominal pain [  ] BRBPR [  ]              Heart burn [  ]  Constipation [  ] Diarrhea  [  ] Colonoscopy [   ]        GU : Hematuria [  ]  Dysuria [  ]  Nocturia [  ] UTI's [  ]        Vascular : Claudication [  ]  Rest pain [  ]  DVT [  ] Vein stripping [  ] leg ulcers [  ]                          TIA [  ] Stroke [  ]  Varicose veins [  ]        NEURO :  Headaches  [  ] Seizures [  ] Vision changes [  ] Paresthesias [  ]                                                Musculoskeletal :  Arthritis [  ] Gout  [  ]  Back pain [  ]  Joint pain [  ]        Skin :  Rash [  ]  Melanoma [  ] Sores [  ]        Heme : Bleeding problems [  ]Clotting Disorders [  ] Anemia [  ]Blood Transfusion [ ]         Endocrine : Diabetes [  ] Heat or Cold intolerance [  ] Polyuria [  ]excessive thirst [ ]         Psych : Depression [  ]  Anxiety [  ]  Psych hospitalizations [  ] Memory change [  ]  BP 120/79 (BP Location: Right Arm)   Pulse 80   Temp 98.4 F (36.9 C) (Temporal)   Resp 20   Ht 5\' 10"  (1.778 m)   Wt 213 lb (96.6 kg)   SpO2 97% Comment: RA  BMI 30.56 kg/m  Physical Exam      Exam    General- alert and comfortable    Neck- no JVD, no cervical adenopathy palpable, no carotid bruit   Lungs- clear without rales, wheezes   Cor- regular rate and rhythm, no murmur , gallop   Abdomen- soft, non-tender   Extremities - warm, non-tender, minimal edema   Neuro- oriented, appropriate, no focal weakness  Diagnostic Tests: CTA performed today personally viewed and discussed with patient.  Results as noted above.  The ascending aortic fusiform aneurysm is stable at 4.7 cm.  Unchanged since 2017  Impression: Blood pressure control weight control and continued 81 mg aspirin and Lipitor are the best long-term treatment to keep the aorta from further dilating.  However with a bicuspid valve he needs to have annual scans.  Guidelines for surgery in a patient with bicuspid valve is a diameter > 5.0 cm His blood pressure being normal his risk remains less than 2%. Plan: Turn in 1 year for CTA of thoracic aorta  2018, MD [facility] [phone]

## 2019-08-10 ENCOUNTER — Other Ambulatory Visit: Payer: Self-pay | Admitting: Cardiovascular Disease

## 2019-08-10 NOTE — Telephone Encounter (Signed)
Please schedule office visit with Dr. Arida. Thank you! 

## 2019-08-10 NOTE — Telephone Encounter (Signed)
LVM for patient to call back and schedule

## 2019-08-25 NOTE — Telephone Encounter (Signed)
Attempted to schedule.  LMOV to call office.  ° °

## 2019-09-09 ENCOUNTER — Other Ambulatory Visit: Payer: Self-pay | Admitting: Cardiovascular Disease

## 2019-09-10 NOTE — Telephone Encounter (Signed)
Patient is seeing Dr. Donata Clay and does not wish to keep seeing Dr. Kirke Corin

## 2019-09-16 ENCOUNTER — Other Ambulatory Visit: Payer: Self-pay | Admitting: Cardiovascular Disease

## 2019-12-20 ENCOUNTER — Encounter: Payer: Self-pay | Admitting: Cardiothoracic Surgery

## 2020-06-19 ENCOUNTER — Other Ambulatory Visit: Payer: Self-pay | Admitting: Cardiothoracic Surgery

## 2020-06-19 DIAGNOSIS — I712 Thoracic aortic aneurysm, without rupture, unspecified: Secondary | ICD-10-CM

## 2020-07-27 ENCOUNTER — Other Ambulatory Visit: Payer: Self-pay

## 2020-07-27 ENCOUNTER — Ambulatory Visit (INDEPENDENT_AMBULATORY_CARE_PROVIDER_SITE_OTHER): Payer: BC Managed Care – PPO | Admitting: Cardiothoracic Surgery

## 2020-07-27 ENCOUNTER — Ambulatory Visit
Admission: RE | Admit: 2020-07-27 | Discharge: 2020-07-27 | Disposition: A | Discharge status: Home / Self Care | Payer: BC Managed Care – PPO | Source: Ambulatory Visit | Attending: Cardiothoracic Surgery | Admitting: Cardiothoracic Surgery

## 2020-07-27 VITALS — BP 113/73 | HR 55 | Resp 20 | Ht 70.0 in | Wt 213.0 lb

## 2020-07-27 DIAGNOSIS — I712 Thoracic aortic aneurysm, without rupture, unspecified: Secondary | ICD-10-CM

## 2020-07-27 DIAGNOSIS — I7121 Aneurysm of the ascending aorta, without rupture: Secondary | ICD-10-CM

## 2020-07-27 MED ORDER — IOPAMIDOL (ISOVUE-370) INJECTION 76%
75.0000 mL | Freq: Once | INTRAVENOUS | Status: AC | PRN
  Administered 2020-07-27: 75 mL via INTRAVENOUS

## 2020-08-10 NOTE — Progress Notes (Signed)
Chief complaint: Aneurysm surveillance  History of present illness: 60 year old man previously followed by Dr. Donata Clay for sub-5 cm ascending aortic aneurysm.  The patient is well-known to me socially due to having recently operated on his wife. He denies chest pain or other difficulties  Active Ambulatory Problems    Diagnosis Date Noted   Obstructive sleep apnea 04/01/2012   Erectile dysfunction 10/25/2013   Ascending aortic aneurysm (HCC) 02/01/2015   Bicuspid aortic valve 03/09/2015   Coronary artery disease involving native coronary artery of native heart with angina pectoris (HCC) 09/21/2015   Trigger finger 09/21/2015   Hyperlipidemia 09/21/2015   Resolved Ambulatory Problems    Diagnosis Date Noted   Hyperlipidemia LDL goal < 100 05/24/2011   Murmur, cardiac 05/24/2011   Other malaise and fatigue 02/26/2012   Snoring 02/26/2012   Left otitis media 04/01/2012   Left maxillary sinusitis 04/01/2012   Bereavement 05/08/2012   Routine general medical examination at a health care facility 10/23/2012   Left otitis media 10/23/2012   Need for prophylactic vaccination and inoculation against influenza 10/23/2012   Chest pain 01/23/2015   Faintness 02/01/2015   Abnormal CT of liver 03/09/2015   Past Medical History:  Diagnosis Date   COVID-19 04/2019   Non-obstructive CAD (coronary artery disease)      Current Outpatient Medications on File Prior to Visit  Medication Sig Dispense Refill   acetaminophen (TYLENOL) 325 MG tablet Take 650 mg by mouth every 6 (six) hours as needed for mild pain, moderate pain, fever or headache.     aspirin 81 MG chewable tablet Chew 81 mg by mouth daily.     atorvastatin (LIPITOR) 40 MG tablet TAKE 1 TABLET BY MOUTH EVERY DAY *CONTACT OFFICE TO SCHEDULE APPT FOR FURTHER REFILLS 15 tablet 0   metoprolol succinate (TOPROL-XL) 25 MG 24 hr tablet Take 25 mg by mouth daily.  6   Multiple Vitamin (MULTIVITAMIN) tablet Take 1 tablet by mouth daily.      No current facility-administered medications on file prior to visit.   Physical exam: BP 113/73   Pulse (!) 55   Resp 20   Ht 5\' 10"  (1.778 m)   Wt 96.6 kg   SpO2 98% Comment: RA  BMI 30.56 kg/m  Well-appearing man in no acute distress Clear to auscultation bilaterally Regular rate and rhythm Extremities warm and well-perfused  Imaging: I personally reviewed his chest CT from 07/27/2020 which showed a stable 4.7 cm ascending aortic aneurysm  Impression/plan: 60 year old man with stable ascending aortic aneurysm relative to size.  Continue blood pressure control.  Follow-up in 1 year  Teliyah Royal Z. 42, MD [phone]

## 2020-09-17 ENCOUNTER — Emergency Department: Payer: BC Managed Care – PPO

## 2020-09-17 ENCOUNTER — Encounter: Payer: Self-pay | Admitting: Emergency Medicine

## 2020-09-17 ENCOUNTER — Other Ambulatory Visit: Payer: Self-pay

## 2020-09-17 ENCOUNTER — Emergency Department
Admission: EM | Admit: 2020-09-17 | Discharge: 2020-09-17 | Disposition: A | Discharge status: Home / Self Care | Payer: BC Managed Care – PPO | Attending: Emergency Medicine | Admitting: Emergency Medicine

## 2020-09-17 DIAGNOSIS — Z91010 Allergy to peanuts: Secondary | ICD-10-CM | POA: Insufficient documentation

## 2020-09-17 DIAGNOSIS — I10 Essential (primary) hypertension: Secondary | ICD-10-CM | POA: Diagnosis not present

## 2020-09-17 DIAGNOSIS — R079 Chest pain, unspecified: Secondary | ICD-10-CM

## 2020-09-17 DIAGNOSIS — R0602 Shortness of breath: Secondary | ICD-10-CM | POA: Diagnosis not present

## 2020-09-17 DIAGNOSIS — Z7982 Long term (current) use of aspirin: Secondary | ICD-10-CM | POA: Insufficient documentation

## 2020-09-17 DIAGNOSIS — R1013 Epigastric pain: Secondary | ICD-10-CM | POA: Diagnosis not present

## 2020-09-17 DIAGNOSIS — Z8616 Personal history of COVID-19: Secondary | ICD-10-CM | POA: Diagnosis not present

## 2020-09-17 DIAGNOSIS — Z79899 Other long term (current) drug therapy: Secondary | ICD-10-CM | POA: Diagnosis not present

## 2020-09-17 DIAGNOSIS — Z87891 Personal history of nicotine dependence: Secondary | ICD-10-CM | POA: Insufficient documentation

## 2020-09-17 DIAGNOSIS — I2510 Atherosclerotic heart disease of native coronary artery without angina pectoris: Secondary | ICD-10-CM | POA: Diagnosis not present

## 2020-09-17 LAB — HEPATIC FUNCTION PANEL
ALT: 18 U/L (ref 0–44)
AST: 22 U/L (ref 15–41)
Albumin: 3.7 g/dL (ref 3.5–5.0)
Alkaline Phosphatase: 88 U/L (ref 38–126)
Bilirubin, Direct: 0.1 mg/dL (ref 0.0–0.2)
Indirect Bilirubin: 0.8 mg/dL (ref 0.3–0.9)
Total Bilirubin: 0.9 mg/dL (ref 0.3–1.2)
Total Protein: 6.8 g/dL (ref 6.5–8.1)

## 2020-09-17 LAB — BASIC METABOLIC PANEL
Anion gap: 6 (ref 5–15)
BUN: 14 mg/dL (ref 6–20)
CO2: 28 mmol/L (ref 22–32)
Calcium: 8.9 mg/dL (ref 8.9–10.3)
Chloride: 103 mmol/L (ref 98–111)
Creatinine, Ser: 1.09 mg/dL (ref 0.61–1.24)
GFR, Estimated: >60 mL/min (ref >60)
Glucose, Bld: 129 mg/dL — ABNORMAL HIGH (ref 70–99)
Potassium: 4 mmol/L (ref 3.5–5.1)
Sodium: 137 mmol/L (ref 135–145)

## 2020-09-17 LAB — CBC
HCT: 43.9 % (ref 39.0–52.0)
Hemoglobin: 14.8 g/dL (ref 13.0–17.0)
MCH: 31.1 pg (ref 26.0–34.0)
MCHC: 33.7 g/dL (ref 30.0–36.0)
MCV: 92.2 fL (ref 80.0–100.0)
Platelets: 291 10*3/uL (ref 150–400)
RBC: 4.76 MIL/uL (ref 4.22–5.81)
RDW: 13.1 % (ref 11.5–15.5)
WBC: 9.8 10*3/uL (ref 4.0–10.5)
nRBC: 0 % (ref 0.0–0.2)

## 2020-09-17 LAB — TROPONIN I (HIGH SENSITIVITY)
Troponin I (High Sensitivity): 3 ng/L (ref <18)
Troponin I (High Sensitivity): 6 ng/L (ref <18)

## 2020-09-17 LAB — LIPASE, BLOOD: Lipase: 33 U/L (ref 11–51)

## 2020-09-17 MED ORDER — IOHEXOL 350 MG/ML SOLN
100.0000 mL | Freq: Once | INTRAVENOUS | Status: AC | PRN
  Administered 2020-09-17: 100 mL via INTRAVENOUS

## 2020-09-17 MED ORDER — LIDOCAINE VISCOUS HCL 2 % MT SOLN
15.0000 mL | Freq: Once | OROMUCOSAL | Status: AC
  Administered 2020-09-17: 15 mL via ORAL
  Filled 2020-09-17: qty 15

## 2020-09-17 MED ORDER — MORPHINE SULFATE (PF) 4 MG/ML IV SOLN
4.0000 mg | Freq: Once | INTRAVENOUS | Status: AC
  Administered 2020-09-17: 4 mg via INTRAVENOUS
  Filled 2020-09-17: qty 1

## 2020-09-17 MED ORDER — FAMOTIDINE 20 MG PO TABS
20.0000 mg | ORAL_TABLET | Freq: Once | ORAL | Status: AC
  Administered 2020-09-17: 20 mg via ORAL
  Filled 2020-09-17: qty 1

## 2020-09-17 MED ORDER — ALUM & MAG HYDROXIDE-SIMETH 200-200-20 MG/5ML PO SUSP
30.0000 mL | Freq: Once | ORAL | Status: AC
  Administered 2020-09-17: 30 mL via ORAL
  Filled 2020-09-17: qty 30

## 2020-09-17 NOTE — ED Notes (Signed)
Patient is resting comfortably. 

## 2020-09-17 NOTE — ED Notes (Signed)
Pt comes with daughter who state she thinks he is having a heart attack. Pt clenching chest and bent over at doorway of lobby. Pt placed in wheelchair and taken to ED 19 by EDT Vernona Rieger.

## 2020-09-17 NOTE — ED Notes (Addendum)
XR at bedside

## 2020-09-17 NOTE — ED Notes (Signed)
Pt NAD, a/ox4. Pt verbalizes understanding of all DC and f/u instructions. All questions answered. Pt walks with steady gait to lobby at DC with family member  

## 2020-09-17 NOTE — ED Notes (Signed)
Patient transported to CT 

## 2020-09-17 NOTE — ED Triage Notes (Addendum)
Pt to ED POV c/o CP that started a couple hours ago. CP feels like "someone sitting on chest", sharp, that goes across chest and shoulder.  Pt has nausea but denies vomiting, and abdominal pain.  Pt also feels SOB w/ exertion.   Pt has hx of aneurysm, and has enlarged heart valve Pt takes low dose aspirin

## 2020-09-17 NOTE — Discharge Instructions (Signed)
Your blood work was reassuring today and does not show that you have had a heart attack.  We obtained a CAT scan of your aorta which does not show any increase in your aneurysm.  Your blood work was also reassuring.  There was nothing found on the CAT scan that would require surgery.  It is possible that your pain is related to some inflammation of the stomach lining.  If your symptoms worsen or change in any way that is concerning to you, do not hesitate to the return to the emergency department.  Please follow-up with your primary care physician in 1 week.

## 2020-09-17 NOTE — ED Provider Notes (Signed)
[facility]  ____________________________________________   Event Date/Time   First MD Initiated Contact with Patient 09/17/20 1619     (approximate)  I have reviewed the triage vital signs and the nursing notes.   HISTORY  Chief Complaint Chest Pain    HPI Nicholas Juarez. is a 60 y.o. male with past medical history of thoracic aneurysm, hypertension who presents with chest pain.  Symptoms started acutely today around 130 while he was watching TV.  He endorses a sharp chest pain in the center of the chest that is nonradiating.  Has been constant since onset.  He has no associated nausea or diaphoresis.  Has had shortness of breath.  Denies weakness numbness.  Denies vomiting.  Does have pain in the upper abdomen as well.         Past Medical History:  Diagnosis Date   Ascending aortic aneurysm (HCC)    a. 05/2018 CTA Chest: 4.7 x 4.7 cm Asc thor Ao - not significantly changed.   Bicuspid aortic valve    a. 10/2016 Echo: Ef 55-60%, no rwma. Bicuspid AoV. Mildly dil Ao root (3.8cm), mildly to mod dil Asc Ao 4-4.4cm. Nl RV fxn.   COVID-19 04/2019   Hyperlipidemia    Non-obstructive CAD (coronary artery disease)    a. 10/2015 Cath: LAD 30p, otw nl cors. Nl EF.    Patient Active Problem List   Diagnosis Date Noted   Coronary artery disease involving native coronary artery of native heart with angina pectoris (HCC) 09/21/2015   Trigger finger 09/21/2015   Hyperlipidemia 09/21/2015   Bicuspid aortic valve 03/09/2015   Ascending aortic aneurysm (HCC) 02/01/2015   Erectile dysfunction 10/25/2013   Obstructive sleep apnea 04/01/2012    Past Surgical History:  Procedure Laterality Date   CARDIAC CATHETERIZATION Left 11/06/2015   Procedure: Left Heart Cath and Coronary Angiography;  Surgeon: Iran Ouch, MD;  Location: [facility] INVASIVE CV LAB;  Service: Cardiovascular;  Laterality: Left;    Prior to Admission medications   Medication Sig Start  Date End Date Taking? Authorizing Provider  acetaminophen (TYLENOL) 325 MG tablet Take 650 mg by mouth every 6 (six) hours as needed for mild pain, moderate pain, fever or headache.    [provider]  aspirin 81 MG chewable tablet Chew 81 mg by mouth daily.    [provider]  atorvastatin (LIPITOR) 40 MG tablet TAKE 1 TABLET BY MOUTH EVERY DAY *CONTACT OFFICE TO SCHEDULE APPT FOR FURTHER REFILLS 09/09/19   Iran Ouch, MD  metoprolol succinate (TOPROL-XL) 25 MG 24 hr tablet Take 25 mg by mouth daily. 04/23/17   [provider]  Multiple Vitamin (MULTIVITAMIN) tablet Take 1 tablet by mouth daily.    [provider]    Allergies Motrin [ibuprofen], Peanut allergen powder-dnfp, and Shellfish allergy  Family History  Problem Relation Age of Onset   Dementia Mother    COPD Father    Cancer Sister 73       breast    Social History Social History   Tobacco Use   Smoking status: Former   Smokeless tobacco: Never  Building services engineer Use: Never used  Substance Use Topics   Alcohol use: No    Comment: seldom   Drug use: No    Review of Systems   Review of Systems  Constitutional:  Negative for chills and fever.  Respiratory:  Positive for chest tightness and shortness of breath.   Cardiovascular:  Positive for chest pain. Negative for palpitations and leg swelling.  Gastrointestinal:  Positive for abdominal pain. Negative for nausea and vomiting.  Neurological:  Negative for weakness and numbness.  All other systems reviewed and are negative.  Physical Exam Updated Vital Signs BP 98/65   Pulse 61   Temp 97.8 F (36.6 C) (Oral)   Resp 16   Ht 5\' 9"  (1.753 m)   Wt 95.3 kg   SpO2 93%   BMI 31.01 kg/m   Physical Exam Vitals and nursing note reviewed.  Constitutional:      General: He is in acute distress.     Appearance: Normal appearance. He is diaphoretic.     Comments: Patient is in mild distress, looks uncomfortable  HENT:      Head: Normocephalic and atraumatic.  Eyes:     General: No scleral icterus.    Conjunctiva/sclera: Conjunctivae normal.  Cardiovascular:     Heart sounds: Normal heart sounds.  Pulmonary:     Effort: Pulmonary effort is normal. No respiratory distress.     Breath sounds: Normal breath sounds. No wheezing.  Abdominal:     Palpations: Abdomen is soft.     Tenderness: There is abdominal tenderness.     Comments: Patient is tender to palpation in the epigastrium with voluntary guarding  Musculoskeletal:        General: No deformity or signs of injury.     Cervical back: Normal range of motion.  Skin:    General: Skin is warm.     Coloration: Skin is not jaundiced or pale.  Neurological:     General: No focal deficit present.     Mental Status: He is alert and oriented to person, place, and time. Mental status is at baseline.  Psychiatric:        Mood and Affect: Mood normal.        Behavior: Behavior normal.     LABS (all labs ordered are listed, but only abnormal results are displayed)  Labs Reviewed  BASIC METABOLIC PANEL - Abnormal; Notable for the following components:      Result Value   Glucose, Bld 129 (*)    All other components within normal limits  CBC  LIPASE, BLOOD  HEPATIC FUNCTION PANEL  TROPONIN I (HIGH SENSITIVITY)  TROPONIN I (HIGH SENSITIVITY)   ____________________________________________  EKG  Left axis deviation, normal intervals, no acute ischemic changes ____________________________________________  RADIOLOGY , personally viewed and evaluated these images (plain radiographs) as part of my medical decision making, as well as reviewing the written report by the radiologist.  ED MD interpretation: I reviewed the CT angio of the chest abdomen and pelvis which does not show any aortic dissection or other acute abdominal pathology  I reviewed the chest x-ray which does not show any acute cardiopulmonary  process    ____________________________________________   PROCEDURES  Procedure(s) performed (including Critical Care):  Procedures   ____________________________________________   INITIAL IMPRESSION / ASSESSMENT AND PLAN / ED COURSE     60 year old male who presents with acute onset of sharp chest pain.  His vital signs are within normal limits however he does appear uncomfortable.  He is tender in the epigastric region.  Patient has a history of a prior thoracic aneurysm.  I am concerned for aortic dissection.  Differential also includes ACS, pancreatitis, cholecystitis or other abdominal pathology.  Will obtain a CT angio of the chest abdomen pelvis.  Will give pain control.  I am waiving  the creatinine.  Patient's first troponin is negative.  Repeat troponin after 2 hours is also negative.  His labs are reassuring, no LFT modalities or elevated lipase.  After pain medication patient is feeling significantly improved.  I have low suspicion for cardiac ischemia based on his EKG and 2 negative troponins.  We have ruled out dissection.  It is possible that this is related to GERD/gastritis.  I discussed the results with the patient and advised that he follow-up with his primary care provider.  We discussed return precautions as well.  Clinical Course as of 09/17/20 1926  Wynelle Link Sep 17, 2020  1324 IMPRESSION: 1. Stable 4.7 cm ascending thoracic aortic aneurysm, with no evidence of dissection. Ascending thoracic aortic aneurysm. Recommend semi-annual imaging followup by CTA or MRA and referral to cardiothoracic surgery if not already obtained. This recommendation follows 2010 ACCF/AHA/AATS/ACR/ASA/SCA/SCAI/SIR/STS/SVM Guidelines for the Diagnosis and Management of Patients With Thoracic Aortic Disease. Circulation. 2010; 121: M010-U725. Aortic aneurysm NOS (ICD10-I71.9) 2. No evidence of pulmonary embolus. 3. No acute intrathoracic, intra-abdominal, or intrapelvic process.   [KM]   1920 Troponin I (High Sensitivity): 6 [KM]    Clinical Course User Index [KM] Georga Hacking, MD     ____________________________________________   FINAL CLINICAL IMPRESSION(S) / ED DIAGNOSES  Final diagnoses:  Chest pain, unspecified type     ED Discharge Orders     None        Note:  This document was prepared using Dragon voice recognition software and may include unintentional dictation errors.    Georga Hacking, MD 09/17/20 [phone]

## 2021-06-13 ENCOUNTER — Other Ambulatory Visit: Payer: Self-pay | Admitting: Cardiothoracic Surgery

## 2021-06-13 ENCOUNTER — Other Ambulatory Visit: Payer: Self-pay | Admitting: Surgery

## 2021-06-13 DIAGNOSIS — I7121 Aneurysm of the ascending aorta, without rupture: Secondary | ICD-10-CM

## 2021-07-23 ENCOUNTER — Ambulatory Visit: Payer: BC Managed Care – PPO | Admitting: Cardiothoracic Surgery

## 2021-07-23 ENCOUNTER — Ambulatory Visit
Admission: RE | Admit: 2021-07-23 | Discharge: 2021-07-23 | Disposition: A | Discharge status: Home / Self Care | Payer: BC Managed Care – PPO | Source: Ambulatory Visit | Attending: Cardiothoracic Surgery | Admitting: Cardiothoracic Surgery

## 2021-07-23 DIAGNOSIS — I7121 Aneurysm of the ascending aorta, without rupture: Secondary | ICD-10-CM

## 2021-08-06 ENCOUNTER — Ambulatory Visit: Payer: BC Managed Care – PPO | Admitting: Cardiothoracic Surgery

## 2021-08-20 ENCOUNTER — Ambulatory Visit (INDEPENDENT_AMBULATORY_CARE_PROVIDER_SITE_OTHER): Payer: BC Managed Care – PPO | Admitting: Cardiothoracic Surgery

## 2021-08-20 ENCOUNTER — Encounter: Payer: Self-pay | Admitting: Cardiothoracic Surgery

## 2021-08-20 VITALS — BP 131/82 | HR 75 | Resp 20 | Ht 70.0 in | Wt 196.9 lb

## 2021-08-20 DIAGNOSIS — Q2381 Bicuspid aortic valve: Secondary | ICD-10-CM | POA: Insufficient documentation

## 2021-08-20 DIAGNOSIS — I7121 Aneurysm of the ascending aorta, without rupture: Secondary | ICD-10-CM | POA: Diagnosis not present

## 2021-08-20 DIAGNOSIS — Q231 Congenital insufficiency of aortic valve: Secondary | ICD-10-CM | POA: Diagnosis not present

## 2021-08-20 NOTE — Progress Notes (Signed)
HPI: Patient presents for evaluation of a stable asymptomatic 4.7 fusiform ascending aneurysm unchanged in the past 5 years.  He had a CT scan earlier this summer.  He remains asymptomatic.  His blood pressure remains under control with Toprol-XL 25 mg.  I reviewed the images of the most recent scan myself and the aortic diameter remains at 4.7 cm.  There is no evidence of mural thickening or ulceration of the aortic wall.  Patient has a bicuspid aortic valve by his last echo which was 5 years ago.  At that time he had a normal LV function and there was minimal calcification of the aortic valve with no aortic stenosis or insufficiency.  On his next follow-up we will arrange for an echocardiogram as well.  Current Outpatient Medications  Medication Sig Dispense Refill   acetaminophen (TYLENOL) 325 MG tablet Take 650 mg by mouth every 6 (six) hours as needed for mild pain, moderate pain, fever or headache.     aspirin 81 MG chewable tablet Chew 81 mg by mouth daily.     Multiple Vitamin (MULTIVITAMIN) tablet Take 1 tablet by mouth daily.     Omega-3 1000 MG CAPS Take 1 capsule by mouth daily.     atorvastatin (LIPITOR) 40 MG tablet TAKE 1 TABLET BY MOUTH EVERY DAY *CONTACT OFFICE TO SCHEDULE APPT FOR FURTHER REFILLS (Patient not taking: Reported on 08/20/2021) 15 tablet 0   metoprolol succinate (TOPROL-XL) 25 MG 24 hr tablet Take 25 mg by mouth daily. (Patient not taking: Reported on 08/20/2021)  6   No current facility-administered medications for this visit.     Physical Exam: Blood pressure 131/82, pulse 75, resp. rate 20, height 5\' 10"  (1.778 m), weight 196 lb 14.4 oz (89.3 kg), SpO2 97 %.        Exam    General- alert and comfortable    Neck- no JVD, no cervical adenopathy palpable, no carotid bruit   Lungs- clear without rales, wheezes   Cor- regular rate and rhythm, no murmur , gallop   Abdomen- soft, non-tender   Extremities - warm, non-tender, minimal edema   Neuro- oriented,  appropriate, no focal weakness   Diagnostic Tests: CT scan without contrast shows no change in a stable moderate fusiform ascending aneurysm 4.7 cm diameter.  Impression: Patient is asymptomatic with a moderate 4.7 cm fusiform ascending aneurysm associated with a bicuspid aortic valve. He is not smoking has well-controlled blood pressure on low-dose Toprol. He needs continued surveillance in the next visit for a CT scan we will arrange for an echocardiogram as well. Plan: Return in 1 year for surveillance scan of moderate ascending fusiform aneurysm.   , MD [facility] [phone]

## 2021-09-11 ENCOUNTER — Ambulatory Visit
Admission: EM | Admit: 2021-09-11 | Discharge: 2021-09-11 | Disposition: A | Discharge status: Home / Self Care | Payer: BC Managed Care – PPO

## 2021-09-11 ENCOUNTER — Encounter: Payer: Self-pay | Admitting: Emergency Medicine

## 2021-09-11 DIAGNOSIS — M5432 Sciatica, left side: Secondary | ICD-10-CM | POA: Diagnosis not present

## 2021-09-11 DIAGNOSIS — S39012A Strain of muscle, fascia and tendon of lower back, initial encounter: Secondary | ICD-10-CM

## 2021-09-11 MED ORDER — DEXAMETHASONE SODIUM PHOSPHATE 10 MG/ML IJ SOLN
10.0000 mg | Freq: Once | INTRAMUSCULAR | Status: AC
  Administered 2021-09-11: 10 mg via INTRAMUSCULAR

## 2021-09-11 MED ORDER — BACLOFEN 10 MG PO TABS: 10.0000 mg | ORAL_TABLET | Freq: Three times a day (TID) | ORAL | 0 refills | Status: AC

## 2021-09-11 MED ORDER — PREDNISONE 10 MG (21) PO TBPK: ORAL_TABLET | ORAL | 0 refills | Status: AC

## 2021-09-11 NOTE — ED Provider Notes (Signed)
[facility]    CSN: [id number] Arrival date & time: 09/11/21  1136      History   Chief Complaint Chief Complaint  Patient presents with   Back Pain    Pain radiating down left leg - Entered by patient    HPI Nicholas Juarez. is a 61 y.o. male.   HPI  61 year old male here for evaluation of low back pain.  Patient reports that he has been experiencing pain in his left low back for the last 3 days.  He works at a print works overnight and states that he has not been lifting anything new or unusual.  He denies any discrete injury or any precipitating event.  He states that last night while he was at work the pain started to go down into his left thigh.  It hurts to sit or change position and feels better to stand.  Past Medical History:  Diagnosis Date   Ascending aortic aneurysm (HCC)    a. 05/2018 CTA Chest: 4.7 x 4.7 cm Asc thor Ao - not significantly changed.   Bicuspid aortic valve    a. 10/2016 Echo: Ef 55-60%, no rwma. Bicuspid AoV. Mildly dil Ao root (3.8cm), mildly to mod dil Asc Ao 4-4.4cm. Nl RV fxn.   COVID-19 04/2019   Hyperlipidemia    Non-obstructive CAD (coronary artery disease)    a. 10/2015 Cath: LAD 30p, otw nl cors. Nl EF.    Patient Active Problem List   Diagnosis Date Noted   Aortopathy associated with bicuspid aortic valve 08/20/2021   Coronary artery disease involving native coronary artery of native heart with angina pectoris (HCC) 09/21/2015   Trigger finger 09/21/2015   Hyperlipidemia 09/21/2015   Bicuspid aortic valve 03/09/2015   Ascending aortic aneurysm (HCC) 02/01/2015   Erectile dysfunction 10/25/2013   Obstructive sleep apnea 04/01/2012    Past Surgical History:  Procedure Laterality Date   CARDIAC CATHETERIZATION Left 11/06/2015   Procedure: Left Heart Cath and Coronary Angiography;  Surgeon: Iran Ouch, MD;  Location: [facility] INVASIVE CV LAB;  Service: Cardiovascular;  Laterality: Left;       Home  Medications    Prior to Admission medications   Medication Sig Start Date End Date Taking? Authorizing Provider  aspirin 81 MG chewable tablet Chew 81 mg by mouth daily.   Yes [provider]  baclofen (LIORESAL) 10 MG tablet Take 1 tablet (10 mg total) by mouth 3 (three) times daily. 09/11/21  Yes Becky Augusta, NP  clonazePAM (KLONOPIN) 0.25 MG disintegrating tablet Take by mouth. 10/15/20  Yes [provider]  Multiple Vitamin (MULTIVITAMIN) tablet Take 1 tablet by mouth daily.   Yes [provider]  Omega-3 1000 MG CAPS Take 1 capsule by mouth daily.   Yes [provider]  Omeprazole Magnesium (PRILOSEC PO)    Yes [provider]  predniSONE (STERAPRED UNI-PAK 21 TAB) 10 MG (21) TBPK tablet Take 6 tablets on day 1, 5 tablets day 2, 4 tablets day 3, 3 tablets day 4, 2 tablets day 5, 1 tablet day 6 09/11/21  Yes Becky Augusta, NP  acetaminophen (TYLENOL) 325 MG tablet Take 650 mg by mouth every 6 (six) hours as needed for mild pain, moderate pain, fever or headache.    [provider]  atorvastatin (LIPITOR) 40 MG tablet TAKE 1 TABLET BY MOUTH EVERY DAY *CONTACT OFFICE TO SCHEDULE APPT FOR FURTHER REFILLS Patient not taking: Reported on 08/20/2021 09/09/19   Iran Ouch, MD  metoprolol succinate (TOPROL-XL) 25 MG 24 hr tablet Take 25 mg by mouth daily. Patient not taking: Reported on 08/20/2021 04/23/17   [provider]    Family History Family History  Problem Relation Age of Onset   Dementia Mother    COPD Father    Cancer Sister 70       breast    Social History Social History   Tobacco Use   Smoking status: Former   Smokeless tobacco: Never  Building services engineer Use: Never used  Substance Use Topics   Alcohol use: No    Comment: seldom   Drug use: No     Allergies   Motrin [ibuprofen], Peanut allergen powder-dnfp, and Shellfish allergy   Review of Systems Review of Systems  Constitutional:  Negative for  fever.  Musculoskeletal:  Positive for back pain and myalgias.  Skin:  Negative for color change.  Neurological:  Negative for weakness and numbness.  Hematological: Negative.   Psychiatric/Behavioral: Negative.       Physical Exam Triage Vital Signs ED Triage Vitals  Enc Vitals Group     BP 09/11/21 1323 116/89     Pulse Rate 09/11/21 1323 71     Resp 09/11/21 1323 18     Temp 09/11/21 1323 98 F (36.7 C)     Temp Source 09/11/21 1323 Oral     SpO2 09/11/21 1323 98 %     Weight --      Height --      Head Circumference --      Peak Flow --      Pain Score 09/11/21 1326 8     Pain Loc --      Pain Edu? --      Excl. in GC? --    No data found.  Updated Vital Signs BP 116/89 (BP Location: Left Arm)   Pulse 71   Temp 98 F (36.7 C) (Oral)   Resp 18   SpO2 98%   Visual Acuity Right Eye Distance:   Left Eye Distance:   Bilateral Distance:    Right Eye Near:   Left Eye Near:    Bilateral Near:     Physical Exam Vitals and nursing note reviewed.  Constitutional:      Appearance: Normal appearance. He is not ill-appearing.  HENT:     Head: Normocephalic and atraumatic.  Cardiovascular:     Rate and Rhythm: Normal rate and regular rhythm.     Pulses: Normal pulses.     Heart sounds: Normal heart sounds. No murmur heard.    No friction rub. No gallop.  Pulmonary:     Effort: Pulmonary effort is normal.     Breath sounds: Normal breath sounds. No wheezing, rhonchi or rales.  Musculoskeletal:        General: Tenderness present. No swelling, deformity or signs of injury.  Skin:    General: Skin is warm and dry.     Capillary Refill: Capillary refill takes less than 2 seconds.     Findings: No erythema or rash.  Neurological:     General: No focal deficit present.     Mental Status: He is alert and oriented to person, place, and time.  Psychiatric:        Mood and Affect: Mood normal.        Behavior: Behavior normal.        Thought Content: Thought  content normal.        Judgment: Judgment  normal.      UC Treatments / Results  Labs (all labs ordered are listed, but only abnormal results are displayed) Labs Reviewed - No data to display  EKG   Radiology No results found.  Procedures Procedures (including critical care time)  Medications Ordered in UC Medications  dexamethasone (DECADRON) injection 10 mg (has no administration in time range)    Initial Impression / Assessment and Plan / UC Course  I have reviewed the triage vital signs and the nursing notes.  Pertinent labs & imaging results that were available during my care of the patient were reviewed by me and considered in my medical decision making (see chart for details).   Patient is a nontoxic-appearing 72-year-old male here for evaluation of 3 days worth of left low back pain that is radiating down into his left thigh.  This is not in the setting of any known injury or discrete precipitating event.  He states he has had pain in his low back before but is typically on the right side and this is the first time its been on his left.  On exam patient does not have any midline lumbar spinous process tenderness or step-off.  He does have significant tenderness and mild spasm in the left lower paralumbar region.  The right paralumbar region is normal.  The pain does not radiate down into his buttock and it cannot be triggered with palpation of the quadriceps complex or the IT band of the left thigh.  Patient's bilateral lower extremity strength is 5/5 and his DTRs are 2+.  He does have a positive seated straight leg raise on the left.  I will treat the patient for low back pain with left-sided sciatica.  He is a cardiac patient so I will treat him with a shot of Decadron in clinic and have him start prednisone taper tomorrow morning.  I will also treat him with baclofen 10 mg every 8 hours as needed for muscle spasm.  I have given him home physical therapy exercises to do as well.   Work note provided.   Final Clinical Impressions(s) / UC Diagnoses   Final diagnoses:  Strain of lumbar region, initial encounter  Sciatica of left side     Discharge Instructions      Take the prednisone as prescribed.  Take the baclofen, 10 mg every 8 hours, on a schedule for the next 48 hours and then as needed.  Apply moist heat to your back for 30 minutes at a time 2-3 times a day to improve blood flow to the area and help remove the lactic acid causing the spasm.  Follow the back exercises given at discharge.  Return for reevaluation for any new or worsening symptoms.      ED Prescriptions     Medication Sig Dispense Auth. Provider   predniSONE (STERAPRED UNI-PAK 21 TAB) 10 MG (21) TBPK tablet Take 6 tablets on day 1, 5 tablets day 2, 4 tablets day 3, 3 tablets day 4, 2 tablets day 5, 1 tablet day 6 21 tablet Margarette Canada, NP   baclofen (LIORESAL) 10 MG tablet Take 1 tablet (10 mg total) by mouth 3 (three) times daily. 38 each Margarette Canada, NP      PDMP not reviewed this encounter.   Margarette Canada, NP 09/11/21 [phone]

## 2021-09-11 NOTE — ED Triage Notes (Signed)
Pt presents with left side lower back pain x 3 days. Pt states he has done some lifting, but nothing heavier that what he's used to. The pain became worse at work last night and the pain started to radiate down his left leg

## 2021-09-11 NOTE — Discharge Instructions (Signed)
Take the prednisone as prescribed.  Take the baclofen, 10 mg every 8 hours, on a schedule for the next 48 hours and then as needed.  Apply moist heat to your back for 30 minutes at a time 2-3 times a day to improve blood flow to the area and help remove the lactic acid causing the spasm.  Follow the back exercises given at discharge.  Return for reevaluation for any new or worsening symptoms.

## 2021-12-29 ENCOUNTER — Encounter: Payer: Self-pay | Admitting: Emergency Medicine

## 2021-12-29 ENCOUNTER — Ambulatory Visit
Admission: RE | Admit: 2021-12-29 | Discharge: 2021-12-29 | Disposition: A | Discharge status: Home / Self Care | Payer: BC Managed Care – PPO | Source: Ambulatory Visit | Attending: Internal Medicine | Admitting: Internal Medicine

## 2021-12-29 ENCOUNTER — Ambulatory Visit
Admission: EM | Admit: 2021-12-29 | Discharge: 2021-12-29 | Disposition: A | Discharge status: Home / Self Care | Payer: BC Managed Care – PPO | Attending: Internal Medicine | Admitting: Internal Medicine

## 2021-12-29 DIAGNOSIS — J069 Acute upper respiratory infection, unspecified: Secondary | ICD-10-CM | POA: Diagnosis not present

## 2021-12-29 DIAGNOSIS — R2232 Localized swelling, mass and lump, left upper limb: Secondary | ICD-10-CM | POA: Insufficient documentation

## 2021-12-29 NOTE — ED Provider Notes (Signed)
[facility]    CSN: [id number] Arrival date & time: 12/29/21  0836      History   Chief Complaint Chief Complaint  Patient presents with   Cyst   Cough    HPI Nicholas Juarez. is a 61 y.o. male with a history of HLD, CAD presents to UC today with complaint of a cyst to the left side of his neck.  He noticed this 1 month ago.  He has not noticed any drainage from the area but the cyst has gotten bigger in size.  He has not tried anything OTC for this.  He also reports runny nose, sore throat and cough.  This started 2 days ago.  He is blowing clear mucus out of his nose.  He denies difficulty swallowing.  The cough is mostly nonproductive.  He denies headache, nasal congestion, ear pain, shortness of breath, chest pain, nausea, vomiting or diarrhea.  He denies fever, chills or body aches.  He has tried Zyrtec, Flonase and Robitussin OTC with some relief of symptoms.  He has had sick contacts with similar symptoms.  HPI  Past Medical History:  Diagnosis Date   Ascending aortic aneurysm ([place])    a. 05/2018 CTA Chest: 4.7 x 4.7 cm Asc thor Ao - not significantly changed.   Bicuspid aortic valve    a. 10/2016 Echo: Ef 55-60%, no rwma. Bicuspid AoV. Mildly dil Ao root (3.8cm), mildly to mod dil Asc Ao 4-4.4cm. Nl RV fxn.   COVID-19 04/2019   Hyperlipidemia    Non-obstructive CAD (coronary artery disease)    a. 10/2015 Cath: LAD 30p, otw nl cors. Nl EF.    Patient Active Problem List   Diagnosis Date Noted   Aortopathy associated with bicuspid aortic valve 08/20/2021   Coronary artery disease involving native coronary artery of native heart with angina pectoris ([place]) 09/21/2015   Trigger finger 09/21/2015   Hyperlipidemia 09/21/2015   Bicuspid aortic valve 03/09/2015   Ascending aortic aneurysm ([place]) 02/01/2015   Erectile dysfunction 10/25/2013   Obstructive sleep apnea 04/01/2012    Past Surgical History:  Procedure Laterality Date   CARDIAC CATHETERIZATION  Left 11/06/2015   Procedure: Left Heart Cath and Coronary Angiography;  Surgeon: Wellington Hampshire, MD;  Location: [facility];  Service: Cardiovascular;  Laterality: Left;       Home Medications    Prior to Admission medications   Medication Sig Start Date End Date Taking? Authorizing Provider  acetaminophen (TYLENOL) 325 MG tablet Take 650 mg by mouth every 6 (six) hours as needed for mild pain, moderate pain, fever or headache.    [provider]  aspirin 81 MG chewable tablet Chew 81 mg by mouth daily.    [provider]  atorvastatin (LIPITOR) 40 MG tablet TAKE 1 TABLET BY MOUTH EVERY DAY *CONTACT OFFICE TO SCHEDULE APPT FOR FURTHER REFILLS Patient not taking: Reported on 08/20/2021 09/09/19   Wellington Hampshire, MD  baclofen (LIORESAL) 10 MG tablet Take 1 tablet (10 mg total) by mouth 3 (three) times daily. 09/11/21   Margarette Canada, NP  clonazePAM (KLONOPIN) 0.25 MG disintegrating tablet Take by mouth. 10/15/20   [provider]  metoprolol succinate (TOPROL-XL) 25 MG 24 hr tablet Take 25 mg by mouth daily. Patient not taking: Reported on 08/20/2021 04/23/17   [provider]  Multiple Vitamin (MULTIVITAMIN) tablet Take 1 tablet by mouth daily.    [provider]  Omega-3 1000 MG CAPS Take 1 capsule  by mouth daily.    [provider]  Omeprazole Magnesium (PRILOSEC PO)     [provider]  predniSONE (STERAPRED UNI-PAK 21 TAB) 10 MG (21) TBPK tablet Take 6 tablets on day 1, 5 tablets day 2, 4 tablets day 3, 3 tablets day 4, 2 tablets day 5, 1 tablet day 6 09/11/21   Becky Augusta, NP    Family History Family History  Problem Relation Age of Onset   Dementia Mother    COPD Father    Cancer Sister 19       breast    Social History Social History   Tobacco Use   Smoking status: Former   Smokeless tobacco: Never  Building services engineer Use: Never used  Substance Use Topics   Alcohol use: No    Comment: seldom   Drug  use: No     Allergies   Motrin [ibuprofen], Peanut allergen powder-dnfp, and Shellfish allergy   Review of Systems Review of Systems  Constitutional:  Negative for chills and fever.  HENT:  Positive for postnasal drip, rhinorrhea and sore throat. Negative for congestion, ear pain, sinus pressure and sinus pain.   Respiratory:  Positive for cough. Negative for chest tightness and shortness of breath.   Cardiovascular:  Negative for chest pain.  Gastrointestinal:  Negative for diarrhea, nausea and vomiting.  Musculoskeletal:  Negative for myalgias.  Skin:  Negative for rash.       Cyst to left side of neck  Neurological:  Negative for dizziness and light-headedness.     Physical Exam Triage Vital Signs ED Triage Vitals  Enc Vitals Group     BP 12/29/21 0915 125/81     Pulse Rate 12/29/21 0915 68     Resp 12/29/21 0915 14     Temp 12/29/21 0915 98.2 F (36.8 C)     Temp Source 12/29/21 0915 Oral     SpO2 12/29/21 0915 99 %     Weight 12/29/21 0914 196 lb 13.9 oz (89.3 kg)     Height 12/29/21 0914 5\' 10"  (1.778 m)     Head Circumference --      Peak Flow --      Pain Score 12/29/21 0913 0     Pain Loc --      Pain Edu? --      Excl. in GC? --    No data found.  Updated Vital Signs BP 125/81 (BP Location: Left Arm)   Pulse 68   Temp 98.2 F (36.8 C) (Oral)   Resp 14   Ht 5\' 10"  (1.778 m)   Wt 196 lb 13.9 oz (89.3 kg)   SpO2 99%   BMI 28.25 kg/m   Visual Acuity     Physical Exam Constitutional:      Appearance: Normal appearance. He is not ill-appearing.  HENT:     Head: Normocephalic.     Comments: No sinus tenderness noted    Nose: Rhinorrhea present. No congestion.     Mouth/Throat:     Mouth: Mucous membranes are moist.     Pharynx: Oropharynx is clear. No oropharyngeal exudate or posterior oropharyngeal erythema.  Eyes:     Extraocular Movements: Extraocular movements intact.     Conjunctiva/sclera: Conjunctivae normal.     Pupils: Pupils are  equal, round, and reactive to light.  Cardiovascular:     Rate and Rhythm: Normal rate and regular rhythm.  Pulmonary:     Effort: Pulmonary effort is normal.  Breath sounds: Normal breath sounds. No wheezing, rhonchi or rales.  Skin:    General: Skin is warm and dry.     Findings: No rash.     Comments: 7 cm x 7 cm mass noted to the left side of the neck just above the collarbone  Neurological:     Mental Status: He is alert and oriented to person, place, and time.      UC Treatments / Results   Radiology Imaging Orders         US SOFT TISSUE HEAD & NECK (NON-THYROID)      Medications Ordered in UC Medications - No data to display  Initial Impression / Assessment and Plan / UC Course  I have reviewed the triage vital signs and the nursing notes.  Pertinent labs & imaging results that were available during my care of the patient were reviewed by me and considered in my medical decision making (see chart for details).     61 year old male with mass to the left side of the neck/shoulder x 1 month.  Will obtain ultrasound that we will have done at the medical mall for further evaluation of symptoms.  Also reports 2-day history of runny nose, sore throat and cough.  He declines flu and COVID testing at this time.  Advised him that his symptoms are likely viral and will treat this symptomatically.  He will continue Zyrtec and Flonase.  He declines Rx for cough syrup at this time.  Encouraged rest and fluids.  Final Clinical Impressions(s) / UC Diagnoses   Final diagnoses:  Mass of skin of left shoulder  Viral URI with cough     Discharge Instructions      You were seen today for mass of the left side of your neck/shoulder.  Have ordered an ultrasound but you need to go to the medical mall at [facility] to get done.  You are also diagnosed with a viral respiratory infection.  We did not test you for COVID or flu.  I recommend you continue Zyrtec, Flonase and Robitussin OTC.  I  encourage rest and fluids.  Please follow-up if your symptoms persist or worsen.     ED Prescriptions   None    PDMP not reviewed this encounter.   Jearld Fenton, NP 12/29/21 [phone]

## 2021-12-29 NOTE — ED Triage Notes (Addendum)
Patient reports that he has had a cyst on the left side of his neck for a month.  Patient c/o cough, runny nose that started 2 days ago.  Patient denies fevers. Patient declined flu and covid test today.

## 2021-12-29 NOTE — Discharge Instructions (Signed)
You were seen today for mass of the left side of your neck/shoulder.  Have ordered an ultrasound but you need to go to the medical mall at [facility] to get done.  You are also diagnosed with a viral respiratory infection.  We did not test you for COVID or flu.  I recommend you continue Zyrtec, Flonase and Robitussin OTC.  I encourage rest and fluids.  Please follow-up if your symptoms persist or worsen.

## 2022-01-11 ENCOUNTER — Other Ambulatory Visit: Payer: Self-pay | Admitting: Family Medicine

## 2022-01-11 DIAGNOSIS — S29012A Strain of muscle and tendon of back wall of thorax, initial encounter: Secondary | ICD-10-CM

## 2022-01-17 ENCOUNTER — Telehealth: Payer: Self-pay

## 2022-01-17 ENCOUNTER — Other Ambulatory Visit: Payer: Self-pay

## 2022-01-17 DIAGNOSIS — Z1211 Encounter for screening for malignant neoplasm of colon: Secondary | ICD-10-CM

## 2022-01-17 MED ORDER — NA SULFATE-K SULFATE-MG SULF 17.5-3.13-1.6 GM/177ML PO SOLN: 1.0000 | Freq: Once | ORAL | 0 refills | Status: AC

## 2022-01-17 NOTE — Telephone Encounter (Signed)
Gastroenterology Pre-Procedure Review  Request Date: 02/15/22 Requesting Physician: Dr. Vicente Males  PATIENT REVIEW QUESTIONS: The patient responded to the following health history questions as indicated:    1. Are you having any GI issues? no 2. Do you have a personal history of Polyps? no 3. Do you have a family history of Colon Cancer or Polyps? yes (MATERNAL 1ST COUSIN STAGE 3 COLON CANCER ) 4. Diabetes Mellitus? no 5. Joint replacements in the past 12 months?no 6. Major health problems in the past 3 months?no 7. Any artificial heart valves, MVP, or defibrillator? AORTIC ANEURYSYM  CLEARANCE SENT TO DR VAN TRIGHT    MEDICATIONS & ALLERGIES:    Patient reports the following regarding taking any anticoagulation/antiplatelet therapy:   Plavix, Coumadin, Eliquis, Xarelto, Lovenox, Pradaxa, Brilinta, or Effient? no Aspirin? yes (81MG )  Patient confirms/reports the following medications:  Current Outpatient Medications  Medication Sig Dispense Refill   acetaminophen (TYLENOL) 325 MG tablet Take 650 mg by mouth every 6 (six) hours as needed for mild pain, moderate pain, fever or headache.     aspirin 81 MG chewable tablet Chew 81 mg by mouth daily.     atorvastatin (LIPITOR) 40 MG tablet TAKE 1 TABLET BY MOUTH EVERY DAY *CONTACT OFFICE TO SCHEDULE APPT FOR FURTHER REFILLS (Patient not taking: Reported on 08/20/2021) 15 tablet 0   baclofen (LIORESAL) 10 MG tablet Take 1 tablet (10 mg total) by mouth 3 (three) times daily. 30 each 0   clonazePAM (KLONOPIN) 0.25 MG disintegrating tablet Take by mouth.     metoprolol succinate (TOPROL-XL) 25 MG 24 hr tablet Take 25 mg by mouth daily. (Patient not taking: Reported on 08/20/2021)  6   Multiple Vitamin (MULTIVITAMIN) tablet Take 1 tablet by mouth daily.     Omega-3 1000 MG CAPS Take 1 capsule by mouth daily.     Omeprazole Magnesium (PRILOSEC PO)      predniSONE (STERAPRED UNI-PAK 21 TAB) 10 MG (21) TBPK tablet Take 6 tablets on day 1, 5 tablets day 2,  4 tablets day 3, 3 tablets day 4, 2 tablets day 5, 1 tablet day 6 21 tablet 0   No current facility-administered medications for this visit.    Patient confirms/reports the following allergies:  Allergies  Allergen Reactions   Motrin [Ibuprofen] Other (See Comments)    Only high doses; skin tingling.   Peanut Allergen Powder-Dnfp    Shellfish Allergy Other (See Comments)    Skin tingling.    No orders of the defined types were placed in this encounter.   AUTHORIZATION INFORMATION Primary Insurance: 1D#: Group #:  Secondary Insurance: 1D#: Group #:  SCHEDULE INFORMATION: Date:  Time: Location:

## 2022-01-19 ENCOUNTER — Ambulatory Visit
Admission: RE | Admit: 2022-01-19 | Discharge: 2022-01-19 | Disposition: A | Discharge status: Home / Self Care | Payer: BC Managed Care – PPO | Source: Ambulatory Visit | Attending: Family Medicine | Admitting: Family Medicine

## 2022-01-19 DIAGNOSIS — S29012A Strain of muscle and tendon of back wall of thorax, initial encounter: Secondary | ICD-10-CM | POA: Insufficient documentation

## 2022-01-29 ENCOUNTER — Telehealth: Payer: Self-pay

## 2022-01-29 NOTE — Telephone Encounter (Signed)
Cardiac clearance received 01/17/22 from Dr. Darcey Nora.  "Patient is clear to have procedure. Additonal Notes: last CT Chest-stable, mild-moderate ascending aneurysm, asymptomatic".  Thanks, [place], [place]

## 2022-02-12 ENCOUNTER — Telehealth: Payer: Self-pay

## 2022-02-12 NOTE — Telephone Encounter (Signed)
Per wife pt and wife have covid. Please call to r/s colonoscopy [phone]

## 2022-02-12 NOTE — Telephone Encounter (Signed)
Return call made to patients wife to reschedule his colonoscopy.  Colonoscopy has been rescheduled to 03/29/22.  Trish in Endo has been notified.  Thanks, [place], [place]

## 2022-03-28 ENCOUNTER — Encounter: Payer: Self-pay | Admitting: Gastroenterology

## 2022-03-29 ENCOUNTER — Encounter: Payer: Self-pay | Admitting: Gastroenterology

## 2022-03-29 ENCOUNTER — Ambulatory Visit: Payer: BC Managed Care – PPO | Admitting: Registered Nurse

## 2022-03-29 ENCOUNTER — Other Ambulatory Visit: Payer: Self-pay

## 2022-03-29 ENCOUNTER — Ambulatory Visit
Admission: RE | Admit: 2022-03-29 | Discharge: 2022-03-29 | Disposition: A | Discharge status: Home / Self Care | Payer: BC Managed Care – PPO | Attending: Gastroenterology | Admitting: Gastroenterology

## 2022-03-29 ENCOUNTER — Encounter
Admission: RE | Disposition: A | Discharge status: Home / Self Care | Payer: Self-pay | Source: Home / Self Care | Attending: Gastroenterology

## 2022-03-29 DIAGNOSIS — D126 Benign neoplasm of colon, unspecified: Secondary | ICD-10-CM

## 2022-03-29 DIAGNOSIS — Z1211 Encounter for screening for malignant neoplasm of colon: Secondary | ICD-10-CM | POA: Diagnosis present

## 2022-03-29 DIAGNOSIS — Z09 Encounter for follow-up examination after completed treatment for conditions other than malignant neoplasm: Secondary | ICD-10-CM | POA: Diagnosis not present

## 2022-03-29 DIAGNOSIS — Z8616 Personal history of COVID-19: Secondary | ICD-10-CM | POA: Insufficient documentation

## 2022-03-29 DIAGNOSIS — I10 Essential (primary) hypertension: Secondary | ICD-10-CM | POA: Diagnosis not present

## 2022-03-29 DIAGNOSIS — I2510 Atherosclerotic heart disease of native coronary artery without angina pectoris: Secondary | ICD-10-CM | POA: Diagnosis not present

## 2022-03-29 DIAGNOSIS — D122 Benign neoplasm of ascending colon: Secondary | ICD-10-CM | POA: Diagnosis not present

## 2022-03-29 DIAGNOSIS — E785 Hyperlipidemia, unspecified: Secondary | ICD-10-CM | POA: Insufficient documentation

## 2022-03-29 DIAGNOSIS — Z87891 Personal history of nicotine dependence: Secondary | ICD-10-CM | POA: Insufficient documentation

## 2022-03-29 HISTORY — PX: COLONOSCOPY WITH PROPOFOL: SHX5780

## 2022-03-29 SURGERY — COLONOSCOPY WITH PROPOFOL
Anesthesia: General

## 2022-03-29 MED ORDER — PROPOFOL 10 MG/ML IV BOLUS
INTRAVENOUS | Status: DC | PRN
  Administered 2022-03-29: 90 mg via INTRAVENOUS

## 2022-03-29 MED ORDER — PROPOFOL 500 MG/50ML IV EMUL
INTRAVENOUS | Status: DC | PRN
  Administered 2022-03-29: 150 ug/kg/min via INTRAVENOUS

## 2022-03-29 MED ORDER — SODIUM CHLORIDE 0.9 % IV SOLN
INTRAVENOUS | Status: DC
Start: 2022-03-29 — End: 2022-03-29

## 2022-03-29 MED ORDER — LIDOCAINE HCL (CARDIAC) PF 100 MG/5ML IV SOSY
PREFILLED_SYRINGE | INTRAVENOUS | Status: DC | PRN
  Administered 2022-03-29: 40 mg via INTRAVENOUS

## 2022-03-29 MED ORDER — PROPOFOL 1000 MG/100ML IV EMUL
INTRAVENOUS | Status: AC
  Filled 2022-03-29: qty 100

## 2022-03-29 NOTE — Op Note (Signed)
[facility] Gastroenterology Patient Name: Nicholas Juarez Procedure Date: 03/29/2022 7:42 AM MRN: [id number] Account #: [id number] Date of Birth: [birth year] Admit Type: Outpatient Age: 62 Room: [facility] ENDO ROOM 4 Gender: Male Note Status: Finalized Instrument Name: Park Meo [id number] Procedure:             Colonoscopy Indications:           Screening for colorectal malignant neoplasm Providers:             Jonathon Bellows MD, MD Referring MD:          No Local Md, MD (Referring MD) Medicines:             Monitored Anesthesia Care Complications:         No immediate complications. Procedure:             Pre-Anesthesia Assessment:                        - Prior to the procedure, a History and Physical was                         performed, and patient medications, allergies and                         sensitivities were reviewed. The patient's tolerance                         of previous anesthesia was reviewed.                        - ASA Grade Assessment: II - A patient with mild                         systemic disease.                        After obtaining informed consent, the colonoscope was                         passed under direct vision. Throughout the procedure,                         the patient's blood pressure, pulse, and oxygen                         saturations were monitored continuously. The                         Colonoscope was introduced through the anus and                         advanced to the the cecum, identified by the                         appendiceal orifice. The colonoscopy was performed                         with ease. The patient tolerated the procedure well.  The quality of the bowel preparation was good. The                         ileocecal valve, appendiceal orifice, and rectum were                         photographed. Findings:      The perianal and digital rectal examinations were normal.      Two  sessile polyps were found in the ascending colon. The polyps were 4       to 5 mm in size. These polyps were removed with a cold snare. Resection       and retrieval were complete.      The exam was otherwise without abnormality on direct and retroflexion       views. Impression:            - Two 4 to 5 mm polyps in the ascending colon, removed                         with a cold snare. Resected and retrieved.                        - The examination was otherwise normal on direct and                         retroflexion views. Recommendation:        - Discharge patient to home (with escort).                        - Resume previous diet.                        - Continue present medications.                        - Await pathology results.                        - Repeat colonoscopy for surveillance based on                         pathology results. Procedure Code(s):     --- Professional ---                        [phone], Colonoscopy, flexible; with removal of                         tumor(s), polyp(s), or other lesion(s) by snare                         technique Diagnosis Code(s):     --- Professional ---                        Z12.11, Encounter for screening for malignant neoplasm                         of colon                        D12.2, Benign neoplasm  of ascending colon CPT copyright 2022 American Medical Association. All rights reserved. The codes documented in this report are preliminary and upon coder review may  be revised to meet current compliance requirements. Jonathon Bellows, MD Jonathon Bellows MD, MD 03/29/2022 8:03:20 AM This report has been signed electronically. Number of Addenda: 0 Note Initiated On: 03/29/2022 7:42 AM Scope Withdrawal Time: 0 hours 12 minutes 2 seconds  Total Procedure Duration: 0 hours 15 minutes 29 seconds  Estimated Blood Loss:  Estimated blood loss: none.      [facility]

## 2022-03-29 NOTE — H&P (Signed)
Nicholas Bellows, MD [address], [place], [place], [place], [address] [address], [place], [place], [place], [address] Phone: [phone]  Fax: [phone]  Primary Care Physician:  Marygrace Drought, MD   Pre-Procedure History & Physical: HPI:  N[employer]olas [employer]. is a 62 y.o. male is here for an colonoscopy.   Past Medical History:  Diagnosis Date   Ascending aortic aneurysm ([place])    a. 05/2018 CTA Chest: 4.7 x 4.7 cm Asc thor Ao - not significantly changed.   Bicuspid aortic valve    a. 10/2016 Echo: Ef 55-60%, no rwma. Bicuspid AoV. Mildly dil Ao root (3.8cm), mildly to mod dil Asc Ao 4-4.4cm. Nl RV fxn.   COVID-19 04/2019   Hyperlipidemia    Non-obstructive CAD (coronary artery disease)    a. 10/2015 Cath: LAD 30p, otw nl cors. Nl EF.    Past Surgical History:  Procedure Laterality Date   CARDIAC CATHETERIZATION Left 11/06/2015   Procedure: Left Heart Cath and Coronary Angiography;  Surgeon: Wellington Hampshire, MD;  Location: [facility];  Service: Cardiovascular;  Laterality: Left;    Prior to Admission medications   Medication Sig Start Date End Date Taking? Authorizing Provider  acetaminophen (TYLENOL) 325 MG tablet Take 650 mg by mouth every 6 (six) hours as needed for mild pain, moderate pain, fever or headache.    [provider]  aspirin 81 MG chewable tablet Chew 81 mg by mouth daily.    [provider]  atorvastatin (LIPITOR) 40 MG tablet TAKE 1 TABLET BY MOUTH EVERY DAY *CONTACT OFFICE TO SCHEDULE APPT FOR FURTHER REFILLS Patient not taking: Reported on 08/20/2021 09/09/19   Wellington Hampshire, MD  baclofen (LIORESAL) 10 MG tablet Take 1 tablet (10 mg total) by mouth 3 (three) times daily. 09/11/21   Margarette Canada, NP  clonazePAM (KLONOPIN) 0.25 MG disintegrating tablet Take by mouth. 10/15/20   [provider]  metoprolol succinate (TOPROL-XL) 25 MG 24 hr tablet Take 25 mg by mouth daily. Patient not taking: Reported on  08/20/2021 04/23/17   [provider]  Multiple Vitamin (MULTIVITAMIN) tablet Take 1 tablet by mouth daily.    [provider]  Omega-3 1000 MG CAPS Take 1 capsule by mouth daily.    [provider]  Omeprazole Magnesium (PRILOSEC PO)     [provider]  predniSONE (STERAPRED UNI-PAK 21 TAB) 10 MG (21) TBPK tablet Take 6 tablets on day 1, 5 tablets day 2, 4 tablets day 3, 3 tablets day 4, 2 tablets day 5, 1 tablet day 6 Patient not taking: Reported on 03/29/2022 09/11/21   Margarette Canada, NP    Allergies as of 01/17/2022 - Review Complete 12/29/2021  Allergen Reaction Noted   Motrin [ibuprofen] Other (See Comments) 05/24/2011   Peanut allergen powder-dnfp  04/14/2020   Shellfish allergy Other (See Comments) 05/24/2011    Family History  Problem Relation Age of Onset   Dementia Mother    COPD Father    Cancer Sister 6       breast    Social History   Socioeconomic History   Marital status: Married    Spouse name: Not on file   Number of children: Not on file   Years of education: Not on file   Highest education level: Not on file  Occupational History   Not on file  Tobacco Use   Smoking status: Former   Smokeless tobacco: Never  Vaping Use  Vaping Use: Never used  Substance and Sexual Activity   Alcohol use: No    Comment: seldom   Drug use: No   Sexual activity: Not on file  Other Topics Concern   Not on file  Social History Narrative   Lives with wife in [place].    Work - Chemical engineer   Diet - regular   Exercise - none    Social Determinants of Radio broadcast assistant Strain: Not on file  Food Insecurity: Not on file  Transportation Needs: Not on file  Physical Activity: Not on file  Stress: Not on file  Social Connections: Not on file  Intimate Partner Violence: Not on file    Review of Systems: See HPI, otherwise negative ROS  Physical Exam: BP 106/87   Pulse 78   Temp (!) 97.1 F (36.2 C) (Temporal)   Resp  18   Ht 5\' 10"  (1.778 m)   Wt 86.6 kg   SpO2 100%   BMI 27.41 kg/m  General:   Alert,  pleasant and cooperative in NAD Head:  Normocephalic and atraumatic. Neck:  Supple; no masses or thyromegaly. Lungs:  Clear throughout to auscultation, normal respiratory effort.    Heart:  +S1, +S2, Regular rate and rhythm, No edema. Abdomen:  Soft, nontender and nondistended. Normal bowel sounds, without guarding, and without rebound.   Neurologic:  Alert and  oriented x4;  grossly normal neurologically.  Impression/Plan: Nicholas Bristle. is here for an colonoscopy to be performed for Screening colonoscopy average risk   Risks, benefits, limitations, and alternatives regarding  colonoscopy have been reviewed with the patient.  Questions have been answered.  All parties agreeable.   Nicholas Bellows, MD  03/29/2022, 7:41 AM

## 2022-03-29 NOTE — Anesthesia Postprocedure Evaluation (Signed)
Anesthesia Post Note  Patient: Nicholas Juarez.  Procedure(s) Performed: COLONOSCOPY WITH PROPOFOL  Patient location during evaluation: PACU Anesthesia Type: General Level of consciousness: awake and oriented Pain management: pain level controlled Vital Signs Assessment: post-procedure vital signs reviewed and stable Respiratory status: spontaneous breathing and nonlabored ventilation Cardiovascular status: stable Anesthetic complications: no   No notable events documented.   Last Vitals:  Vitals:   03/29/22 0815 03/29/22 0825  BP: 100/79 112/78  Pulse: 71   Resp: (!) 22 18  Temp:    SpO2: 98%     Last Pain:  Vitals:   03/29/22 0815  TempSrc:   PainSc: 0-No pain                 VAN STAVEREN,Ronetta Molla

## 2022-03-29 NOTE — Anesthesia Preprocedure Evaluation (Signed)
Anesthesia Evaluation  Patient identified by MRN, date of birth, ID band Patient awake    Reviewed: Allergy & Precautions, NPO status , Patient's Chart, lab work & pertinent test results  Airway Mallampati: III  TM Distance: >3 FB Neck ROM: full    Dental  (+) Teeth Intact   Pulmonary neg pulmonary ROS, sleep apnea , former smoker   Pulmonary exam normal  + decreased breath sounds      Cardiovascular Exercise Tolerance: Good hypertension, Pt. on medications + CAD  negative cardio ROS Normal cardiovascular exam Rhythm:Regular Rate:Normal     Neuro/Psych negative neurological ROS  negative psych ROS   GI/Hepatic negative GI ROS, Neg liver ROS,,,  Endo/Other  negative endocrine ROS    Renal/GU negative Renal ROS  negative genitourinary   Musculoskeletal negative musculoskeletal ROS (+)    Abdominal  (+) + obese  Peds negative pediatric ROS (+)  Hematology negative hematology ROS (+)   Anesthesia Other Findings Past Medical History: No date: Ascending aortic aneurysm ([facility])     Comment:  a. 05/2018 CTA Chest: 4.7 x 4.7 cm Asc thor Ao - not               significantly changed. No date: Bicuspid aortic valve     Comment:  a. 10/2016 Echo: Ef 55-60%, no rwma. Bicuspid AoV.               Mildly dil Ao root (3.8cm), mildly to mod dil Asc Ao               4-4.4cm. Nl RV fxn. 04/2019: COVID-19 No date: Hyperlipidemia No date: Non-obstructive CAD (coronary artery disease)     Comment:  a. 10/2015 Cath: LAD 30p, otw nl cors. Nl EF.  Past Surgical History: 11/06/2015: CARDIAC CATHETERIZATION; Left     Comment:  Procedure: Left Heart Cath and Coronary Angiography;                Surgeon: Wellington Hampshire, MD;  Location: [facility];  Service: Cardiovascular;  Laterality: Left;  BMI    Body Mass Index: 27.41 kg/m      Reproductive/Obstetrics negative OB ROS                              Anesthesia Physical Anesthesia Plan  ASA: 3  Anesthesia Plan: General   Post-op Pain Management:    Induction: Intravenous  PONV Risk Score and Plan: Propofol infusion and TIVA  Airway Management Planned: Natural Airway  Additional Equipment:   Intra-op Plan:   Post-operative Plan:   Informed Consent: I have reviewed the patients History and Physical, chart, labs and discussed the procedure including the risks, benefits and alternatives for the proposed anesthesia with the patient or authorized representative who has indicated his/her understanding and acceptance.     Dental Advisory Given  Plan Discussed with: CRNA and Surgeon  Anesthesia Plan Comments:        Anesthesia Quick Evaluation

## 2022-03-29 NOTE — Anesthesia Procedure Notes (Signed)
Date/Time: 03/29/2022 7:45 AM  Performed by: Doreen Salvage, CRNAPre-anesthesia Checklist: Patient identified, Emergency Drugs available, Suction available and Patient being monitored Patient Re-evaluated:Patient Re-evaluated prior to induction Oxygen Delivery Method: Nasal cannula Induction Type: IV induction Dental Injury: Teeth and Oropharynx as per pre-operative assessment  Comments: Nasal cannula with etCO2 monitoring

## 2022-03-29 NOTE — Transfer of Care (Signed)
Immediate Anesthesia Transfer of Care Note  Patient: Nicholas Juarez.  Procedure(s) Performed: Procedure(s): COLONOSCOPY WITH PROPOFOL (N/A)  Patient Location: PACU and Endoscopy Unit  Anesthesia Type:General  Level of Consciousness: sedated  Airway & Oxygen Therapy: Patient Spontanous Breathing and Patient connected to nasal cannula oxygen  Post-op Assessment: Report given to RN and Post -op Vital signs reviewed and stable  Post vital signs: Reviewed and stable  Last Vitals:  Vitals:   03/29/22 0702 03/29/22 0805  BP: 106/87 95/74  Pulse: 78 66  Resp: 18 (!) 21  Temp: (!) 36.2 C (!) 35.9 C  SpO2: 123XX123 0000000    Complications: No apparent anesthesia complications

## 2022-03-30 NOTE — Progress Notes (Signed)
Spoke with wife Hassan Rowan.

## 2022-04-01 ENCOUNTER — Encounter: Payer: Self-pay | Admitting: Gastroenterology

## 2022-04-01 LAB — SURGICAL PATHOLOGY

## 2022-07-15 ENCOUNTER — Other Ambulatory Visit: Payer: Self-pay | Admitting: Cardiothoracic Surgery

## 2022-07-15 DIAGNOSIS — I7121 Aneurysm of the ascending aorta, without rupture: Secondary | ICD-10-CM

## 2022-08-26 ENCOUNTER — Ambulatory Visit: Payer: BC Managed Care – PPO | Admitting: Cardiothoracic Surgery

## 2022-08-26 ENCOUNTER — Other Ambulatory Visit: Payer: BC Managed Care – PPO

## 2022-12-23 ENCOUNTER — Other Ambulatory Visit: Payer: Self-pay

## 2022-12-23 ENCOUNTER — Emergency Department: Payer: BC Managed Care – PPO

## 2022-12-23 ENCOUNTER — Emergency Department
Admission: EM | Admit: 2022-12-23 | Discharge: 2022-12-23 | Disposition: A | Discharge status: Home / Self Care | Payer: BC Managed Care – PPO | Attending: Emergency Medicine | Admitting: Emergency Medicine

## 2022-12-23 DIAGNOSIS — W109XXA Fall (on) (from) unspecified stairs and steps, initial encounter: Secondary | ICD-10-CM | POA: Diagnosis not present

## 2022-12-23 DIAGNOSIS — S99921A Unspecified injury of right foot, initial encounter: Secondary | ICD-10-CM | POA: Diagnosis present

## 2022-12-23 DIAGNOSIS — S93601A Unspecified sprain of right foot, initial encounter: Secondary | ICD-10-CM | POA: Insufficient documentation

## 2022-12-23 DIAGNOSIS — I2510 Atherosclerotic heart disease of native coronary artery without angina pectoris: Secondary | ICD-10-CM | POA: Insufficient documentation

## 2022-12-23 NOTE — ED Provider Notes (Signed)
[facility] Provider Note    Event Date/Time   First MD Initiated Contact with Patient 12/23/22 [id number]     (approximate)   History   Foot Injury   HPI  Nicholas Juarez. is a 62 y.o. male with PMH of HLD, ascending aortic aneurysm, CAD and bicuspid aortic valve presents for evaluation of right foot injury.  Patient was at work last night when he slipped down some steps injuring the top part of his foot.  He reports swelling to the foot.  He has not taken any medication PTA.  Patient states this will be a Workmen's Comp. injury but was not given paperwork.  Unsure if he will need a drug test.      Physical Exam   Triage Vital Signs: ED Triage Vitals  Encounter Vitals Group     BP 12/23/22 0505 113/86     Systolic BP Percentile --      Diastolic BP Percentile --      Pulse Rate 12/23/22 0505 (!) 112     Resp 12/23/22 0505 17     Temp 12/23/22 0505 98.2 F (36.8 C)     Temp src --      SpO2 12/23/22 0505 97 %     Weight 12/23/22 0505 210 lb (95.3 kg)     Height 12/23/22 0505 5\' 10"  (1.778 m)     Head Circumference --      Peak Flow --      Pain Score 12/23/22 0510 8     Pain Loc --      Pain Education --      Exclude from Growth Chart --     Most recent vital signs: Vitals:   12/23/22 0505  BP: 113/86  Pulse: (!) 112  Resp: 17  Temp: 98.2 F (36.8 C)  SpO2: 97%   General: Awake, no distress.  CV:  Good peripheral perfusion.  Resp:  Normal effort.  Abd:  No distention.  Other:  Mild tenderness to palpation over the midfoot, range of motion maintained, 5/5 strength in compared to the left side, dorsalis pedis pulse 2+ and regular, sensation intact across all dermatomes, capillary refill appropriate.   ED Results / Procedures / Treatments   Labs (all labs ordered are listed, but only abnormal results are displayed) Labs Reviewed - No data to display   RADIOLOGY  Right foot x-rays were obtained, I interpreted the images as  well as reviewed the radiologist report which was negative for any fractures and dislocations.  PROCEDURES:  Critical Care performed: No  Procedures   MEDICATIONS ORDERED IN ED: Medications - No data to display   IMPRESSION / MDM / ASSESSMENT AND PLAN / ED COURSE  I reviewed the triage vital signs and the nursing notes.                             62 year old male presents for evaluation of right foot pain.  Vital signs are stable patient NAD on exam.  Differential diagnosis includes, but is not limited to, fracture, dislocation, strain, sprain.  Patient's presentation is most consistent with acute complicated illness / injury requiring diagnostic workup.  X-rays were negative and patient's physical exam is reassuring.  He does have mild tenderness to palpation over the midfoot.  I suspect he has a foot sprain.  I recommended postop shoe which he declined, so I suggested a hard soled shoe instead.  We discussed icing and elevating the foot.  He was given a note for a day off of work.  He can have Tylenol and ibuprofen as needed for pain.  He voiced understanding, all questions were answered and he was stable at discharge.     FINAL CLINICAL IMPRESSION(S) / ED DIAGNOSES   Final diagnoses:  Sprain of right foot, initial encounter     Rx / DC Orders   ED Discharge Orders     None        Note:  This document was prepared using Dragon voice recognition software and may include unintentional dictation errors.   Cameron Ali, PA-C 12/23/22 0857    Phineas Semen, MD 12/23/22 [phone]

## 2022-12-23 NOTE — Discharge Instructions (Addendum)
The xrays were negative for a fracture, you like have sprained some ligaments in your foot.   You can take 650 mg of Tylenol and 600 mg of ibuprofen every 6 hours as needed for pain.  Ice and elevate your foot today.

## 2022-12-23 NOTE — ED Triage Notes (Signed)
Pt to ED via POV c/o right foot injury. Pt reports he fell down some steps last night, injuring the top part of his foot. Pt has some swelling to foot.
